# Patient Record
Sex: Female | Born: 1980 | Race: Black or African American | Hispanic: No | Marital: Married | Smoking: Never smoker
Health system: Southern US, Community
[De-identification: ages and names within clinical notes are randomized; demographics above are authoritative.]

## PROBLEM LIST (undated history)

## (undated) ENCOUNTER — Emergency Department (HOSPITAL_COMMUNITY): Admission: EM | Payer: Medicaid Other

## (undated) DIAGNOSIS — O24419 Gestational diabetes mellitus in pregnancy, unspecified control: Secondary | ICD-10-CM

## (undated) DIAGNOSIS — I10 Essential (primary) hypertension: Secondary | ICD-10-CM

## (undated) HISTORY — PX: NO PAST SURGERIES: SHX2092

---

## 2015-05-12 NOTE — L&D Delivery Note (Signed)
Operative Delivery Note At 8:54 AM a viable female was delivered via kiwi vacum, placed due to fetal bradycardia and no maternal effort.  Presentation: vertex; Position: Right,, Occiput,, Anterior; Station: 0.  Verbal consent: obtained from patient.  Risks and benefits discussed in detail.  Risks include, but are not limited to the risks of anesthesia, bleeding, infection, damage to maternal tissues, fetal cephalhematoma.  There is also the risk of inability to effect vaginal delivery of the head, or shoulder dystocia that cannot be resolved by established maneuvers, leading to the need for emergency cesarean section.  APGAR:9 ,9 ; weight  .   Placenta status:spontaneous ,shultz .   Cord:  3VC with the following complications:none.  Cord pH: n/a  Anesthesia: none  Instruments: kiwi vacum-placed with 1 pull, delivery of fetal head   Episiotomy:  none Lacerations:  1 degree Suture Repair: 3.0 vicryl Est. Blood Loss (mL):  150  Mom to postpartum.  Baby to Couplet care / Skin to Skin.  Karen Howard 02/10/2016, 9:02 AM

## 2015-05-23 ENCOUNTER — Other Ambulatory Visit: Payer: Self-pay | Admitting: Infectious Disease

## 2015-05-23 ENCOUNTER — Ambulatory Visit
Admission: RE | Admit: 2015-05-23 | Discharge: 2015-05-23 | Disposition: A | Discharge status: Home / Self Care | Payer: No Typology Code available for payment source | Source: Ambulatory Visit | Attending: Infectious Disease | Admitting: Infectious Disease

## 2015-05-23 DIAGNOSIS — R7611 Nonspecific reaction to tuberculin skin test without active tuberculosis: Secondary | ICD-10-CM

## 2015-07-17 DIAGNOSIS — H539 Unspecified visual disturbance: Secondary | ICD-10-CM

## 2015-07-17 DIAGNOSIS — Z23 Encounter for immunization: Secondary | ICD-10-CM

## 2015-07-17 NOTE — Congregational Nurse Program (Signed)
Congregational Nurse Program Note  Date of Encounter: 07/17/2015  Past Medical History: No past medical history on file.  Encounter Details:     CNP Questionnaire - 07/17/15 1415    Patient Demographics   Is this a new or existing patient? New   Patient is considered a/an Refugee   Race African   Patient Assistance   Location of Patient Assistance Not Applicable   Patient's financial/insurance status Medicaid   Uninsured Patient No   Patient referred to apply for the following financial assistance Not Applicable   Food insecurities addressed Not Applicable   Transportation assistance No   Assistance securing medications No   Educational health offerings Navigating the healthcare system;Other   Encounter Details   Primary purpose of visit Navigating the Healthcare System   Was an Emergency Department visit averted? Not Applicable   Does patient have a medical provider? No   Patient referred to Clinic   Was a mental health screening completed? (GAINS tool) No   Does patient have dental issues? No   Does patient have vision issues? Yes   Was a vision referral made? Yes   Since previous encounter, have you referred patient for abnormal blood pressure that resulted in a new diagnosis or medication change? No   Since previous encounter, have you referred patient for abnormal blood glucose that resulted in a new diagnosis or medication change? No     Office visit at [facility] with complaint of visual difficulty reading in classroom and viewing computer.Interpretation line used to communicate language, Swahili. Mother of 8 children. Denies any other health problems. No recent preventive health screens and no assigned PCP. Family members need appointment for immunization update. CBG 122 non-fasting. Plan: Refer to PCP assigned on Medicaid information when available. [facility] vacc[facility]. Refer for visual exam.

## 2015-09-08 ENCOUNTER — Emergency Department (HOSPITAL_COMMUNITY)
Admission: EM | Admit: 2015-09-08 | Discharge: 2015-09-08 | Disposition: A | Discharge status: Home / Self Care | Payer: Medicaid Other | Attending: Emergency Medicine | Admitting: Emergency Medicine

## 2015-09-08 ENCOUNTER — Encounter (HOSPITAL_COMMUNITY): Payer: Self-pay | Admitting: *Deleted

## 2015-09-08 ENCOUNTER — Emergency Department (HOSPITAL_COMMUNITY): Payer: Medicaid Other

## 2015-09-08 DIAGNOSIS — Z3490 Encounter for supervision of normal pregnancy, unspecified, unspecified trimester: Secondary | ICD-10-CM

## 2015-09-08 DIAGNOSIS — O368190 Decreased fetal movements, unspecified trimester, not applicable or unspecified: Secondary | ICD-10-CM

## 2015-09-08 DIAGNOSIS — Z3201 Encounter for pregnancy test, result positive: Secondary | ICD-10-CM | POA: Diagnosis not present

## 2015-09-08 DIAGNOSIS — Z3200 Encounter for pregnancy test, result unknown: Secondary | ICD-10-CM | POA: Diagnosis present

## 2015-09-08 LAB — COMPREHENSIVE METABOLIC PANEL
ALBUMIN: 3.3 g/dL — AB (ref 3.5–5.0)
ALK PHOS: 29 U/L — AB (ref 38–126)
ALT: 10 U/L — ABNORMAL LOW (ref 14–54)
AST: 17 U/L (ref 15–41)
Anion gap: 9 (ref 5–15)
BILIRUBIN TOTAL: 0.8 mg/dL (ref 0.3–1.2)
CALCIUM: 8.9 mg/dL (ref 8.9–10.3)
CO2: 22 mmol/L (ref 22–32)
Chloride: 106 mmol/L (ref 101–111)
Creatinine, Ser: 0.53 mg/dL (ref 0.44–1.00)
GFR calc Af Amer: >60 mL/min (ref >60)
GFR calc non Af Amer: >60 mL/min (ref >60)
GLUCOSE: 96 mg/dL (ref 65–99)
Potassium: 4.3 mmol/L (ref 3.5–5.1)
SODIUM: 137 mmol/L (ref 135–145)
TOTAL PROTEIN: 7 g/dL (ref 6.5–8.1)

## 2015-09-08 LAB — CBC
HEMATOCRIT: 34.3 % — AB (ref 36.0–46.0)
Hemoglobin: 12.1 g/dL (ref 12.0–15.0)
MCH: 29.9 pg (ref 26.0–34.0)
MCHC: 35.3 g/dL (ref 30.0–36.0)
MCV: 84.7 fL (ref 78.0–100.0)
Platelets: 228 10*3/uL (ref 150–400)
RBC: 4.05 MIL/uL (ref 3.87–5.11)
RDW: 13.2 % (ref 11.5–15.5)
WBC: 6 10*3/uL (ref 4.0–10.5)

## 2015-09-08 LAB — HCG, QUANTITATIVE, PREGNANCY: hCG, Beta Chain, Quant, S: 35692 m[IU]/mL — ABNORMAL HIGH (ref <5)

## 2015-09-08 LAB — ABO/RH: ABO/RH(D): A POS

## 2015-09-08 LAB — URINALYSIS, ROUTINE W REFLEX MICROSCOPIC
BILIRUBIN URINE: NEGATIVE
Glucose, UA: NEGATIVE mg/dL
HGB URINE DIPSTICK: NEGATIVE
KETONES UR: NEGATIVE mg/dL
Leukocytes, UA: NEGATIVE
NITRITE: NEGATIVE
PH: 8 (ref 5.0–8.0)
Protein, ur: NEGATIVE mg/dL
Specific Gravity, Urine: 1.009 (ref 1.005–1.030)

## 2015-09-08 LAB — I-STAT BETA HCG BLOOD, ED (MC, WL, AP ONLY): I-stat hCG, quantitative: >2000 m[IU]/mL — ABNORMAL HIGH (ref <5)

## 2015-09-08 MED ORDER — PRENATAL MULTIVITAMIN CH: 1.0000 | ORAL_TABLET | Freq: Every day | ORAL | Status: DC

## 2015-09-08 NOTE — ED Notes (Signed)
Pt reports needs a pregnancy test so she can start going to clinic. Last menstrual cycle was in feb but reports having some lower abd pain, denies any vaginal discharge or bleeding.

## 2015-09-08 NOTE — ED Provider Notes (Signed)
CSN: [id number]     Arrival date & time 09/08/15  1001 History   First MD Initiated Contact with Patient 09/08/15 1139     Chief Complaint  Patient presents with  . Possible Pregnancy   HPI Patient is a G10P9 who presents for pregnancy test and decreased fetal movement. Patient believes she is about 4 months pregnant with her LMP some time in December. She has not yet received any prenatal care. She reports that with her other pregnancy she felt movement by this point in pregnancy so she is worried that she hasn't felt this baby move yet. She denies any bleeding, LOF or discharge. She reports that she sometimes gets some abdominal pain when riding in the car but is not having any pain right now.   History reviewed. No pertinent past medical history. History reviewed. No pertinent past surgical history. History reviewed. No pertinent family history. Social History  Substance Use Topics  . Smoking status: Never Smoker   . Smokeless tobacco: None  . Alcohol Use: No   OB History    No data available     Review of Systems  All other systems reviewed and are negative.  See HPI   Allergies  Review of patient's allergies indicates no known allergies.  Home Medications   Prior to Admission medications   Not on File   BP 109/64 mmHg  Pulse 71  Temp(Src) 97.9 F (36.6 C) (Oral)  Resp 18  SpO2 99% Physical Exam  Constitutional: She is oriented to person, place, and time. She appears well-developed and well-nourished. No distress.  HENT:  Head: Normocephalic and atraumatic.  Right Ear: External ear normal.  Left Ear: External ear normal.  Nose: Nose normal.  Mouth/Throat: Oropharynx is clear and moist.  Eyes: Conjunctivae are normal. Pupils are equal, round, and reactive to light. Right eye exhibits no discharge. Left eye exhibits no discharge.  Neck: Normal range of motion. Neck supple.  Cardiovascular: Normal rate, regular rhythm, normal heart sounds and intact distal pulses.    No murmur heard. Pulmonary/Chest: Effort normal and breath sounds normal. No respiratory distress. She has no wheezes.  Abdominal: Soft. Bowel sounds are normal. She exhibits no distension. There is no tenderness.  Gravid uterus  Musculoskeletal: She exhibits no edema.  Neurological: She is alert and oriented to person, place, and time.  Skin: Skin is warm and dry. No rash noted. She is not diaphoretic. No pallor.  Psychiatric: She has a normal mood and affect. Her behavior is normal.  Vitals reviewed.   ED Course  Procedures (including critical care time) Labs Review Labs Reviewed  CBC - Abnormal; Notable for the following:    HCT 34.3 (*)    All other components within normal limits  I-STAT BETA HCG BLOOD, ED ([facility], [facility], [facility] ONLY) - Abnormal; Notable for the following:    I-stat hCG, quantitative >2000.0 (*)    All other components within normal limits  WET PREP, GENITAL  COMPREHENSIVE METABOLIC PANEL  URINALYSIS, ROUTINE W REFLEX MICROSCOPIC (NOT AT [facility])  HCG, QUANTITATIVE, PREGNANCY  ABO/RH  GC/CHLAMYDIA PROBE AMP ([place]) NOT AT [facility]    Imaging Review No results found. I have personally reviewed and evaluated these images and lab results as part of my medical decision-making.   EKG Interpretation None      MDM   Final diagnoses:  None   Pregnant women with unreliable dating and no fetal movement. Will check OB ultrasound for dating and cardiac activity primarily. If  normal will refer to establish routine prenatal care.  Single intrauterine pregnancy at 17 weeks. [facility]. Rx prenatal vitamins.   Abram SanderElena M Ulanda Tackett, MD 09/08/15 1353  Glynn OctaveStephen Rancour, MD 09/09/15 [phone]

## 2015-09-08 NOTE — Discharge Instructions (Signed)
Second Trimester of Pregnancy  The second trimester is from week 13 through week 28, month 4 through 6. This is often the time in pregnancy that you feel your best. Often times, morning sickness has lessened or quit. You may have more energy, and you may get hungry more often. Your unborn baby (fetus) is growing rapidly. At the end of the sixth month, he or she is about 9 inches long and weighs about 1½ pounds. You will likely feel the baby move (quickening) between 18 and 20 weeks of pregnancy.  HOME CARE   · Avoid all smoking, herbs, and alcohol. Avoid drugs not approved by your doctor.  · Do not use any tobacco products, including cigarettes, chewing tobacco, and electronic cigarettes. If you need help quitting, ask your doctor. You may get counseling or other support to help you quit.  · Only take medicine as told by your doctor. Some medicines are safe and some are not during pregnancy.  · Exercise only as told by your doctor. Stop exercising if you start having cramps.  · Eat regular, healthy meals.  · Wear a good support bra if your breasts are tender.  · Do not use hot tubs, steam rooms, or saunas.  · Wear your seat belt when driving.  · Avoid raw meat, uncooked cheese, and liter boxes and soil used by cats.  · Take your prenatal vitamins.  · Take 1500-2000 milligrams of calcium daily starting at the 20th week of pregnancy until you deliver your baby.  · Try taking medicine that helps you poop (stool softener) as needed, and if your doctor approves. Eat more fiber by eating fresh fruit, vegetables, and whole grains. Drink enough fluids to keep your pee (urine) clear or pale yellow.  · Take warm water baths (sitz baths) to soothe pain or discomfort caused by hemorrhoids. Use hemorrhoid cream if your doctor approves.  · If you have puffy, bulging veins (varicose veins), wear support hose. Raise (elevate) your feet for 15 minutes, 3-4 times a day. Limit salt in your diet.  · Avoid heavy lifting, wear low heals,  and sit up straight.  · Rest with your legs raised if you have leg cramps or low back pain.  · Visit your dentist if you have not gone during your pregnancy. Use a soft toothbrush to brush your teeth. Be gentle when you floss.  · You can have sex (intercourse) unless your doctor tells you not to.  · Go to your doctor visits.  GET HELP IF:   · You feel dizzy.  · You have mild cramps or pressure in your lower belly (abdomen).  · You have a nagging pain in your belly area.  · You continue to feel sick to your stomach (nauseous), throw up (vomit), or have watery poop (diarrhea).  · You have bad smelling fluid coming from your vagina.  · You have pain with peeing (urination).  GET HELP RIGHT AWAY IF:   · You have a fever.  · You are leaking fluid from your vagina.  · You have spotting or bleeding from your vagina.  · You have severe belly cramping or pain.  · You lose or gain weight rapidly.  · You have trouble catching your breath and have chest pain.  · You notice sudden or extreme puffiness (swelling) of your face, hands, ankles, feet, or legs.  · You have not felt the baby move in over an hour.  · You have severe headaches that do   not go away with medicine.  · You have vision changes.     This information is not intended to replace advice given to you by your health care provider. Make sure you discuss any questions you have with your health care provider.     Document Released: 07/22/2009 Document Revised: 05/18/2014 Document Reviewed: 06/28/2012  Elsevier Interactive Patient Education ©2016 Elsevier Inc.

## 2015-11-05 ENCOUNTER — Telehealth: Payer: Self-pay | Admitting: *Deleted

## 2015-11-05 NOTE — Telephone Encounter (Signed)
Appointment made for Follow up /17 weeks

## 2015-11-27 ENCOUNTER — Other Ambulatory Visit (HOSPITAL_COMMUNITY)
Admission: RE | Admit: 2015-11-27 | Discharge: 2015-11-27 | Disposition: A | Discharge status: Home / Self Care | Payer: Medicaid Other | Source: Ambulatory Visit | Attending: Family | Admitting: Family

## 2015-11-27 ENCOUNTER — Encounter: Payer: Self-pay | Admitting: Family Medicine

## 2015-11-27 ENCOUNTER — Encounter: Payer: Self-pay | Admitting: Family

## 2015-11-27 ENCOUNTER — Ambulatory Visit (INDEPENDENT_AMBULATORY_CARE_PROVIDER_SITE_OTHER): Payer: Medicaid Other | Admitting: Family

## 2015-11-27 VITALS — BP 115/67 | HR 72 | Wt 179.1 lb

## 2015-11-27 DIAGNOSIS — Z01419 Encounter for gynecological examination (general) (routine) without abnormal findings: Secondary | ICD-10-CM | POA: Insufficient documentation

## 2015-11-27 DIAGNOSIS — Z124 Encounter for screening for malignant neoplasm of cervix: Secondary | ICD-10-CM

## 2015-11-27 DIAGNOSIS — O09523 Supervision of elderly multigravida, third trimester: Secondary | ICD-10-CM

## 2015-11-27 DIAGNOSIS — Z3482 Encounter for supervision of other normal pregnancy, second trimester: Secondary | ICD-10-CM

## 2015-11-27 DIAGNOSIS — O09529 Supervision of elderly multigravida, unspecified trimester: Secondary | ICD-10-CM | POA: Insufficient documentation

## 2015-11-27 DIAGNOSIS — O09522 Supervision of elderly multigravida, second trimester: Secondary | ICD-10-CM | POA: Diagnosis present

## 2015-11-27 DIAGNOSIS — Z113 Encounter for screening for infections with a predominantly sexual mode of transmission: Secondary | ICD-10-CM

## 2015-11-27 DIAGNOSIS — Z1151 Encounter for screening for human papillomavirus (HPV): Secondary | ICD-10-CM | POA: Insufficient documentation

## 2015-11-27 DIAGNOSIS — Z3480 Encounter for supervision of other normal pregnancy, unspecified trimester: Secondary | ICD-10-CM | POA: Insufficient documentation

## 2015-11-27 DIAGNOSIS — Z3492 Encounter for supervision of normal pregnancy, unspecified, second trimester: Secondary | ICD-10-CM

## 2015-11-27 DIAGNOSIS — O09513 Supervision of elderly primigravida, third trimester: Secondary | ICD-10-CM

## 2015-11-27 LAB — OB RESULTS CONSOLE HIV ANTIBODY (ROUTINE TESTING): HIV: NONREACTIVE

## 2015-11-27 LAB — POCT URINALYSIS DIP (DEVICE)
BILIRUBIN URINE: NEGATIVE
Glucose, UA: NEGATIVE mg/dL
HGB URINE DIPSTICK: NEGATIVE
KETONES UR: NEGATIVE mg/dL
Nitrite: NEGATIVE
PH: 6 (ref 5.0–8.0)
Protein, ur: NEGATIVE mg/dL
Specific Gravity, Urine: 1.01 (ref 1.005–1.030)
Urobilinogen, UA: 0.2 mg/dL (ref 0.0–1.0)

## 2015-11-27 LAB — OB RESULTS CONSOLE GC/CHLAMYDIA: GC PROBE AMP, GENITAL: NEGATIVE

## 2015-11-27 LAB — OB RESULTS CONSOLE RPR: RPR: NONREACTIVE

## 2015-11-27 NOTE — Progress Notes (Signed)
1 hour gtt given due  At 9:15

## 2015-11-27 NOTE — Progress Notes (Signed)
   Subjective:    Karen Howard is a G9P0 5843w4d being seen today for her first obstetrical visit.  Her obstetrical history is significant for advanced maternal age. History [place]  Reports no problems or complications with any.   Patient does intend to breast feed. Pregnancy history fully reviewed.  Patient reports no complaints.  Filed Vitals:   11/27/15 0806  BP: 115/67  Pulse: 72  Weight: 179 lb 1.6 oz (81.239 kg)    HISTORY: OB History  Gravida Para Term Preterm AB SAB TAB Ectopic Multiple Living  9        0 8    # Outcome Date GA Lbr Len/2nd Weight Sex Delivery Anes PTL Lv  9 Current           8 Gravida 09/19/13    F   N Y  7 Gravida 11/26/11    M    Y  6 Gravida 06/25/08    M    Y  5 Gravida 07/08/05    F    Y  4 Gravida 06/23/02    F    Y  3 Gravida 01/20/00    Reuben LikesM   Y Y  2 Gravida 07/06/97    M   N Y  1 Gravida 06/22/94    F    Y     History reviewed. No pertinent past medical history. History reviewed. No pertinent past surgical history. History reviewed. No pertinent family history.   Exam   BP 115/67 mmHg  Pulse 72  Wt 179 lb 1.6 oz (81.239 kg)  LMP 05/18/2015  Breastfeeding? Unknown Uterine Size: size equals dates  Pelvic Exam:    Perineum: No Hemorrhoids, Normal Perineum   Vulva: normal   Vagina:  normal mucosa, normal discharge, no palpable nodules   pH: Not done   Cervix: Some  bleeding following Pap which resolved prior to leaving, no cervical motion tenderness and no lesions   Adnexa: normal adnexa and no mass, fullness, tenderness   Bony Pelvis: Adequate  System: Breast:  No nipple retraction or dimpling, No nipple discharge or bleeding, No axillary or supraclavicular adenopathy, Normal to palpation without dominant masses   Skin: normal coloration and turgor, no rashes    Neurologic: negative   Extremities: normal strength, tone, and muscle mass   HEENT neck supple with midline trachea and thyroid  without masses   Mouth/Teeth mucous membranes moist, pharynx normal without lesions   Neck supple and no masses   Cardiovascular: regular rate and rhythm, no murmurs or gallops   Respiratory:  appears well, vitals normal, no respiratory distress, acyanotic, normal RR, neck free of mass or lymphadenopathy, chest clear, no wheezing, crepitations, rhonchi, normal symmetric air entry   Abdomen: soft, non-tender; bowel sounds normal; no masses,  no organomegaly   Urinary: urethral meatus normal      Assessment:    Pregnancy: [id number]  Patient Active Problem List   Diagnosis Date Noted  . Supervision of normal pregnancy, antepartum 11/27/2015        Plan:     Initial labs drawn.  Pap smear collected.  1 hour today Prenatal vitamins. Problem list reviewed and updated. Genetic Screening:  Too late  Ultrasound discussed; fetal survey: ordered.  Follow up in 2 weeks.  Marlis EdelsonKARIM, Shameika Speelman N 11/27/2015

## 2015-11-27 NOTE — Addendum Note (Signed)
Addended by: Cheree DittoGRAHAM, DEMETRICE A on: 11/27/2015 03:24 PM   Modules accepted: Orders

## 2015-11-27 NOTE — Patient Instructions (Signed)

## 2015-11-28 LAB — PAIN MGMT, PROFILE 6 CONF W/O MM, U
6 Acetylmorphine: NEGATIVE ng/mL (ref <10)
ALCOHOL METABOLITES: NEGATIVE ng/mL (ref <500)
Amphetamines: NEGATIVE ng/mL (ref <500)
BARBITURATES: NEGATIVE ng/mL (ref <300)
BENZODIAZEPINES: NEGATIVE ng/mL (ref <100)
COCAINE METABOLITE: NEGATIVE ng/mL (ref <150)
CREATININE: 74.6 mg/dL (ref >=20.0)
MARIJUANA METABOLITE: NEGATIVE ng/mL (ref <20)
METHADONE METABOLITE: NEGATIVE ng/mL (ref <100)
OXYCODONE: NEGATIVE ng/mL (ref <100)
Opiates: NEGATIVE ng/mL (ref <100)
Oxidant: NEGATIVE ug/mL (ref <200)
PHENCYCLIDINE: NEGATIVE ng/mL (ref <25)
Please note:: 0
pH: 6.66 (ref 4.5–9.0)

## 2015-11-28 LAB — PRENATAL PROFILE (SOLSTAS)
Antibody Screen: NEGATIVE
Basophils Absolute: 0 cells/uL (ref 0–200)
Basophils Relative: 0 %
Eosinophils Absolute: 595 cells/uL — ABNORMAL HIGH (ref 15–500)
Eosinophils Relative: 7 %
HEMATOCRIT: 32.1 % — AB (ref 35.0–45.0)
HEP B S AG: NEGATIVE
HIV: NONREACTIVE
Hemoglobin: 10.8 g/dL — ABNORMAL LOW (ref 11.7–15.5)
LYMPHS ABS: 2295 {cells}/uL (ref 850–3900)
Lymphocytes Relative: 27 %
MCH: 29.3 pg (ref 27.0–33.0)
MCHC: 33.6 g/dL (ref 32.0–36.0)
MCV: 87.2 fL (ref 80.0–100.0)
MONO ABS: 510 {cells}/uL (ref 200–950)
MPV: 10.8 fL (ref 7.5–12.5)
Monocytes Relative: 6 %
NEUTROS ABS: 5100 {cells}/uL (ref 1500–7800)
NEUTROS PCT: 60 %
Platelets: 196 10*3/uL (ref 140–400)
RBC: 3.68 MIL/uL — AB (ref 3.80–5.10)
RDW: 13.3 % (ref 11.0–15.0)
Rh Type: POSITIVE
Rubella: 13.9 Index — ABNORMAL HIGH (ref <0.90)
WBC: 8.5 10*3/uL (ref 3.8–10.8)

## 2015-11-28 LAB — GLUCOSE TOLERANCE, 1 HOUR (50G) W/O FASTING: Glucose, 1 Hr, gestational: 115 mg/dL (ref <140)

## 2015-11-28 LAB — GC/CHLAMYDIA PROBE AMP (~~LOC~~) NOT AT ARMC
Chlamydia: NEGATIVE
Neisseria Gonorrhea: NEGATIVE

## 2015-11-29 ENCOUNTER — Encounter (HOSPITAL_COMMUNITY): Payer: Self-pay | Admitting: Family

## 2015-11-29 LAB — HEMOGLOBINOPATHY EVALUATION
HEMATOCRIT: 32.1 % — AB (ref 35.0–45.0)
HEMOGLOBIN: 10.8 g/dL — AB (ref 11.7–15.5)
HGB A: 57.2 % — AB (ref >96.0)
HGB S QUANTITAION: 38.2 % — AB
Hgb A2 Quant: 3.6 % — ABNORMAL HIGH (ref 1.8–3.5)
Hgb F Quant: <1 % (ref <2.0)
MCH: 29.3 pg (ref 27.0–33.0)
MCV: 87.2 fL (ref 80.0–100.0)
RBC: 3.68 MIL/uL — AB (ref 3.80–5.10)
RDW: 13.3 % (ref 11.0–15.0)

## 2015-11-29 LAB — CULTURE, OB URINE
Colony Count: NO GROWTH
ORGANISM ID, BACTERIA: NO GROWTH

## 2015-11-29 LAB — CYTOLOGY - PAP

## 2015-12-06 ENCOUNTER — Other Ambulatory Visit: Payer: Self-pay | Admitting: Family

## 2015-12-06 ENCOUNTER — Ambulatory Visit (HOSPITAL_COMMUNITY)
Admission: RE | Admit: 2015-12-06 | Discharge: 2015-12-06 | Disposition: A | Discharge status: Home / Self Care | Payer: Medicaid Other | Source: Ambulatory Visit | Attending: Family | Admitting: Family

## 2015-12-06 ENCOUNTER — Encounter (HOSPITAL_COMMUNITY): Payer: Self-pay

## 2015-12-06 DIAGNOSIS — Z3A28 28 weeks gestation of pregnancy: Secondary | ICD-10-CM | POA: Diagnosis not present

## 2015-12-06 DIAGNOSIS — Z3A31 31 weeks gestation of pregnancy: Secondary | ICD-10-CM

## 2015-12-06 DIAGNOSIS — O09513 Supervision of elderly primigravida, third trimester: Secondary | ICD-10-CM

## 2015-12-06 DIAGNOSIS — Z3482 Encounter for supervision of other normal pregnancy, second trimester: Secondary | ICD-10-CM

## 2015-12-06 DIAGNOSIS — Z315 Encounter for genetic counseling: Secondary | ICD-10-CM | POA: Insufficient documentation

## 2015-12-06 DIAGNOSIS — O09523 Supervision of elderly multigravida, third trimester: Secondary | ICD-10-CM | POA: Diagnosis not present

## 2015-12-06 DIAGNOSIS — D573 Sickle-cell trait: Secondary | ICD-10-CM

## 2015-12-06 NOTE — ED Notes (Signed)
Pt here today with FOB and interp from language resources Epimenio Sarin).

## 2015-12-06 NOTE — Progress Notes (Signed)
Genetic Counseling  High-Risk Gestation Note  Appointment Date:  12/06/2015 Referred By: Marlis Edelson, CNM Date of Birth:  1981-03-24 Partner: Karen Howard   Pregnancy History: [id number] Estimated Date of Delivery: 02/22/16 Estimated Gestational Age: [redacted]w[redacted]d Attending: Alpha Gula, MD  Ms. Karen Howard, Karen Howard, were seen for genetic counseling because of a maternal age of 35 y.o.. Medical interpreter, Karen Howard, from [employer] provided interpretation for today's visit.     In summary:  Discussed AMA and associated risk for fetal aneuploidy  Discussed options for screening  NIPS- declined  Ultrasound- performed today  Discussed diagnostic testing options  Amniocentesis-declined  Reviewed family history concerns-sickle cell trait (Hemoglobin AS trait) for the patient  Two of the couple's 8 children also with sickle cell trait  Reviewed autosomal recessive inheritance  Father of the pregnancy reported he was previously screened and was negative  Couple is aware that sickle cell is assessed on newborn screening  Discussed carrier screening options  CF-declined  SMA-declined  They were counseled regarding maternal age and the association with risk for chromosome conditions due to nondisjunction with aging of the ova.   We reviewed chromosomes, nondisjunction, and the associated 1 in 123 risk for fetal aneuploidy related to a maternal age of 35 y.o. at delivery. They were counseled that the risk for aneuploidy decreases as gestational age increases, accounting for those pregnancies which spontaneously abort.  We specifically discussed Down syndrome (trisomy 50), trisomies 57 and 32, and sex chromosome aneuploidies (47,XXX and 47,XXY) including the common features and prognoses of each.   We reviewed available screening options including noninvasive prenatal screening (NIPS)/cell free DNA (cfDNA) screening and detailed ultrasound.  Screening tests  are used to modify a patient's a priori risk for aneuploidy, typically based on age. This estimate provides a pregnancy specific risk assessment. We reviewed the benefits and limitations of each option. They were also counseled regarding diagnostic testing via amniocentesis, including risks, benefits, and limitations.  After consideration of all the options, they declined NIPS and amniocentesis.     A complete ultrasound was performed today. The ultrasound report will be sent under separate cover. There were no visualized fetal anomalies or markers suggestive of aneuploidy. They understand that screening tests cannot rule out all birth defects or genetic syndromes. The patient was advised of this limitation and states she still does not want additional testing at this time.   The patient reported that she and two of the couple's children have sickle cell trait. Medical records confirm that Karen Howard had hemoglobin electrophoresis, which identified hemoglobin AS, or sickle cell trait. Karen Howard reported that he has been screened in the past and does not have sickle cell trait. We do not have medical documentation to confirm this report. We discussed that sickle cell anemia (SCA) affects the shape and function of the red blood cell by producing abnormal hemoglobin. They were counseled that hemoglobin is a protein in the RBCs that carries oxygen to the body's organs. Individuals who have SCA have a changes within the genes that codes for hemoglobin. This couple was counseled that SCA is inherited in an autosomal recessive manner, and occurs when both copies of the hemoglobin gene are changed and produce an abnormal hemoglobin S. Typically, one abnormal gene for the production of hemoglobin S is inherited from each parent. A carrier of SCA has one altered copy of the gene for hemoglobin and one typical working copy. Carriers of recessive conditions typically do not  have symptoms related to the condition because  they still have one functioning copy of the gene, and thus some production of the typical protein coded for by that gene. A pregnancy would only be at risk to inherit sickle cell anemia or other hemoglobinopathy if both parents are carriers. Given the report, the pregnancy would not be expected to be at risk for SCA but a 50% chance for sickle cell trait. We discussed the option of hemoglobin electrophoresis for the father of the pregnancy. We also reviewed the availability of newborn screening in [place] for hemoglobinopathies. The couple stated they are comfortable with newborn screening for hemoglobinopathies.   Karen Howard was provided with written information regarding cystic fibrosis (CF), spinal muscular atrophy (SMA) and hemoglobinopathies including the carrier frequency, availability of carrier screening and prenatal diagnosis if indicated.  In addition, we discussed that CF and hemoglobinopathies are routinely screened for as part of the [facility] newborn screening panel.  After further discussion, she declined screening for CF and SMA.  Both family histories were reviewed and found to be otherwise noncontributory for birth defects, intellectual disability, and known genetic conditions. Without further information regarding the provided family history, an accurate genetic risk cannot be calculated. Further genetic counseling is warranted if more information is obtained.  Karen Howard denied exposure to environmental toxins or chemical agents. She denied the use of alcohol, tobacco or street drugs. She denied significant viral illnesses during the course of her pregnancy. Her medical and surgical histories were noncontributory.   I counseled this couple regarding the above risks and available options.  The approximate face-to-face time with the genetic counselor was 40 minutes.  Karen Plowman, MS,  Certified [employer] 12/06/2015

## 2015-12-11 ENCOUNTER — Encounter: Payer: Self-pay | Admitting: Advanced Practice Midwife

## 2015-12-11 ENCOUNTER — Ambulatory Visit (INDEPENDENT_AMBULATORY_CARE_PROVIDER_SITE_OTHER): Payer: Medicaid Other | Admitting: Advanced Practice Midwife

## 2015-12-11 VITALS — BP 108/62 | HR 75 | Wt 179.2 lb

## 2015-12-11 DIAGNOSIS — Z3493 Encounter for supervision of normal pregnancy, unspecified, third trimester: Secondary | ICD-10-CM

## 2015-12-11 DIAGNOSIS — Z3482 Encounter for supervision of other normal pregnancy, second trimester: Secondary | ICD-10-CM

## 2015-12-11 LAB — POCT URINALYSIS DIP (DEVICE)
BILIRUBIN URINE: NEGATIVE
Glucose, UA: NEGATIVE mg/dL
Ketones, ur: NEGATIVE mg/dL
NITRITE: NEGATIVE
PH: 7 (ref 5.0–8.0)
PROTEIN: NEGATIVE mg/dL
Specific Gravity, Urine: 1.01 (ref 1.005–1.030)
UROBILINOGEN UA: 0.2 mg/dL (ref 0.0–1.0)

## 2015-12-11 NOTE — Progress Notes (Signed)
Patient ID: Karen Howard, female   DOB: 01/14/1981, 35 y.o.   MRN: [id number]

## 2015-12-11 NOTE — Patient Instructions (Addendum)

## 2015-12-11 NOTE — Progress Notes (Signed)
Subjective:  Karen Howard is a 35 y.o. [id number] at [redacted]w[redacted]d being seen today for ongoing prenatal care.  She is currently monitored for the following issues for this low-risk pregnancy and has Supervision of normal pregnancy, antepartum; Advanced maternal age in multigravida; and Sickle cell trait (HCC) on her problem list.  Patient reports no complaints. States she wants to stop work.  They make her stand and she gets tired. Contractions: Not present. Vag. Bleeding: None.  Movement: Present. Denies leaking of fluid.   The following portions of the patient's history were reviewed and updated as appropriate: allergies, current medications, past family history, past medical history, past social history, past surgical history and problem list. Problem list updated.  Objective:   Vitals:   12/11/15 1141  BP: 108/62  Pulse: 75  Weight: 179 lb 3.2 oz (81.3 kg)    Fetal Status: Fetal Heart Rate (bpm): 150   Movement: Present     General:  Alert, oriented and cooperative. Patient is in no acute distress.  Skin: Skin is warm and dry. No rash noted.   Cardiovascular: Normal heart rate noted  Respiratory: Normal respiratory effort, no problems with respiration noted  Abdomen: Soft, gravid, appropriate for gestational age. Pain/Pressure: Present     Pelvic:  Cervical exam deferred        Extremities: Normal range of motion.  Edema: None  Mental Status: Normal mood and affect. Normal behavior. Normal judgment and thought content.   Urinalysis: Urine Protein: Negative Urine Glucose: Negative  Assessment and Plan:  Pregnancy: [id number] at [redacted]w[redacted]d  1. Supervision of normal pregnancy, antepartum, second trimester       Wants to stop work. Says they make her stand and she gets tired      Tried to explain disability and FMLA.       Patient instructed to get papers from workplace     Cannot certify medical disability      Discussed if you stop work now, your 12 weeks will run out at [facility]  Preterm labor  symptoms and general obstetric precautions including but not limited to vaginal bleeding, contractions, leaking of fluid and fetal movement were reviewed in detail with the patient. Please refer to After Visit Summary for other counseling recommendations.  Return in about 2 weeks (around 12/25/2015) for [facility].   Aviva Signs, CNM

## 2015-12-11 NOTE — Progress Notes (Signed)
Urine: trace of blood and leukocytes

## 2015-12-25 ENCOUNTER — Ambulatory Visit (INDEPENDENT_AMBULATORY_CARE_PROVIDER_SITE_OTHER): Payer: Medicaid Other | Admitting: Family Medicine

## 2015-12-25 ENCOUNTER — Encounter: Payer: Self-pay | Admitting: Family Medicine

## 2015-12-25 VITALS — BP 106/52 | HR 72 | Wt 181.0 lb

## 2015-12-25 DIAGNOSIS — L299 Pruritus, unspecified: Secondary | ICD-10-CM | POA: Diagnosis not present

## 2015-12-25 DIAGNOSIS — Z23 Encounter for immunization: Secondary | ICD-10-CM

## 2015-12-25 DIAGNOSIS — Z3483 Encounter for supervision of other normal pregnancy, third trimester: Secondary | ICD-10-CM

## 2015-12-25 DIAGNOSIS — Z641 Problems related to multiparity: Secondary | ICD-10-CM | POA: Insufficient documentation

## 2015-12-25 LAB — POCT URINALYSIS DIP (DEVICE)
Bilirubin Urine: NEGATIVE
Glucose, UA: NEGATIVE mg/dL
HGB URINE DIPSTICK: NEGATIVE
KETONES UR: NEGATIVE mg/dL
LEUKOCYTES UA: NEGATIVE
NITRITE: NEGATIVE
PH: 6.5 (ref 5.0–8.0)
Protein, ur: NEGATIVE mg/dL
Specific Gravity, Urine: 1.01 (ref 1.005–1.030)
Urobilinogen, UA: 0.2 mg/dL (ref 0.0–1.0)

## 2015-12-25 MED ORDER — TETANUS-DIPHTH-ACELL PERTUSSIS 5-2.5-18.5 LF-MCG/0.5 IM SUSP
0.5000 mL | Freq: Once | INTRAMUSCULAR | Status: AC
  Administered 2015-12-25: 0.5 mL via INTRAMUSCULAR

## 2015-12-25 NOTE — Patient Instructions (Signed)
Third Trimester of Pregnancy The third trimester is from week 29 through week 42, months 7 through 9. The third trimester is a time when the fetus is growing rapidly. At the end of the ninth month, the fetus is about 20 inches in length and weighs 6-10 pounds.  BODY CHANGES Your body goes through many changes during pregnancy. The changes vary from woman to woman.   Your weight will continue to increase. You can expect to gain 25-35 pounds (11-16 kg) by the end of the pregnancy.  You may begin to get stretch marks on your hips, abdomen, and breasts.  You may urinate more often because the fetus is moving lower into your pelvis and pressing on your bladder.  You may develop or continue to have heartburn as a result of your pregnancy.  You may develop constipation because certain hormones are causing the muscles that push waste through your intestines to slow down.  You may develop hemorrhoids or swollen, bulging veins (varicose veins).  You may have pelvic pain because of the weight gain and pregnancy hormones relaxing your joints between the bones in your pelvis. Backaches may result from overexertion of the muscles supporting your posture.  You may have changes in your hair. These can include thickening of your hair, rapid growth, and changes in texture. Some women also have hair loss during or after pregnancy, or hair that feels dry or thin. Your hair will most likely return to normal after your baby is born.  Your breasts will continue to grow and be tender. A yellow discharge may leak from your breasts called colostrum.  Your belly button may stick out.  You may feel short of breath because of your expanding uterus.  You may notice the fetus "dropping," or moving lower in your abdomen.  You may have a bloody mucus discharge. This usually occurs a few days to a week before labor begins.  Your cervix becomes thin and soft (effaced) near your due date. WHAT TO EXPECT AT YOUR  PRENATAL EXAMS  You will have prenatal exams every 2 weeks until week 36. Then, you will have weekly prenatal exams. During a routine prenatal visit:  You will be weighed to make sure you and the fetus are growing normally.  Your blood pressure is taken.  Your abdomen will be measured to track your baby's growth.  The fetal heartbeat will be listened to.  Any test results from the previous visit will be discussed.  You may have a cervical check near your due date to see if you have effaced. At around 36 weeks, your caregiver will check your cervix. At the same time, your caregiver will also perform a test on the secretions of the vaginal tissue. This test is to determine if a type of bacteria, Group B streptococcus, is present. Your caregiver will explain this further. Your caregiver may ask you:  What your birth plan is.  How you are feeling.  If you are feeling the baby move.  If you have had any abnormal symptoms, such as leaking fluid, bleeding, severe headaches, or abdominal cramping.  If you are using any tobacco products, including cigarettes, chewing tobacco, and electronic cigarettes.  If you have any questions. Other tests or screenings that may be performed during your third trimester include:  Blood tests that check for low iron levels (anemia).  Fetal testing to check the health, activity level, and growth of the fetus. Testing is done if you have certain medical conditions or if   there are problems during the pregnancy.  HIV (human immunodeficiency virus) testing. If you are at high risk, you may be screened for HIV during your third trimester of pregnancy. FALSE LABOR You may feel small, irregular contractions that eventually go away. These are called Braxton Hicks contractions, or false labor. Contractions may last for hours, days, or even weeks before true labor sets in. If contractions come at regular intervals, intensify, or become painful, it is best to be seen  by your caregiver.  SIGNS OF LABOR   Menstrual-like cramps.  Contractions that are 5 minutes apart or less.  Contractions that start on the top of the uterus and spread down to the lower abdomen and back.  A sense of increased pelvic pressure or back pain.  A watery or bloody mucus discharge that comes from the vagina. If you have any of these signs before the 37th week of pregnancy, call your caregiver right away. You need to go to the hospital to get checked immediately. HOME CARE INSTRUCTIONS   Avoid all smoking, herbs, alcohol, and unprescribed drugs. These chemicals affect the formation and growth of the baby.  Do not use any tobacco products, including cigarettes, chewing tobacco, and electronic cigarettes. If you need help quitting, ask your health care provider. You may receive counseling support and other resources to help you quit.  Follow your caregiver's instructions regarding medicine use. There are medicines that are either safe or unsafe to take during pregnancy.  Exercise only as directed by your caregiver. Experiencing uterine cramps is a good sign to stop exercising.  Continue to eat regular, healthy meals.  Wear a good support bra for breast tenderness.  Do not use hot tubs, steam rooms, or saunas.  Wear your seat belt at all times when driving.  Avoid raw meat, uncooked cheese, cat litter boxes, and soil used by cats. These carry germs that can cause birth defects in the baby.  Take your prenatal vitamins.  Take 1500-2000 mg of calcium daily starting at the 20th week of pregnancy until you deliver your baby.  Try taking a stool softener (if your caregiver approves) if you develop constipation. Eat more high-fiber foods, such as fresh vegetables or fruit and whole grains. Drink plenty of fluids to keep your urine clear or pale yellow.  Take warm sitz baths to soothe any pain or discomfort caused by hemorrhoids. Use hemorrhoid cream if your caregiver  approves.  If you develop varicose veins, wear support hose. Elevate your feet for 15 minutes, 3-4 times a day. Limit salt in your diet.  Avoid heavy lifting, wear low heal shoes, and practice good posture.  Rest a lot with your legs elevated if you have leg cramps or low back pain.  Visit your dentist if you have not gone during your pregnancy. Use a soft toothbrush to brush your teeth and be gentle when you floss.  A sexual relationship may be continued unless your caregiver directs you otherwise.  Do not travel far distances unless it is absolutely necessary and only with the approval of your caregiver.  Take prenatal classes to understand, practice, and ask questions about the labor and delivery.  Make a trial run to the hospital.  Pack your hospital bag.  Prepare the baby's nursery.  Continue to go to all your prenatal visits as directed by your caregiver. SEEK MEDICAL CARE IF:  You are unsure if you are in labor or if your water has broken.  You have dizziness.  You have   mild pelvic cramps, pelvic pressure, or nagging pain in your abdominal area.  You have persistent nausea, vomiting, or diarrhea.  You have a bad smelling vaginal discharge.  You have pain with urination. SEEK IMMEDIATE MEDICAL CARE IF:   You have a fever.  You are leaking fluid from your vagina.  You have spotting or bleeding from your vagina.  You have severe abdominal cramping or pain.  You have rapid weight loss or gain.  You have shortness of breath with chest pain.  You notice sudden or extreme swelling of your face, hands, ankles, feet, or legs.  You have not felt your baby move in over an hour.  You have severe headaches that do not go away with medicine.  You have vision changes.   This information is not intended to replace advice given to you by your health care provider. Make sure you discuss any questions you have with your health care provider.   Document Released:  04/21/2001 Document Revised: 05/18/2014 Document Reviewed: 06/28/2012 Elsevier Interactive Patient Education 2016 [employer]  Breastfeeding Deciding to breastfeed is one of the best choices you can make for you and your baby. A change in hormones during pregnancy causes your breast tissue to grow and increases the number and size of your milk ducts. These hormones also allow proteins, sugars, and fats from your blood supply to make breast milk in your milk-producing glands. Hormones prevent breast milk from being released before your baby is born as well as prompt milk flow after birth. Once breastfeeding has begun, thoughts of your baby, as well as his or her sucking or crying, can stimulate the release of milk from your milk-producing glands.  BENEFITS OF BREASTFEEDING For Your Baby  Your first milk (colostrum) helps your baby's digestive system function better.  There are antibodies in your milk that help your baby fight off infections.  Your baby has a lower incidence of asthma, allergies, and sudden infant death syndrome.  The nutrients in breast milk are better for your baby than infant formulas and are designed uniquely for your baby's needs.  Breast milk improves your baby's brain development.  Your baby is less likely to develop other conditions, such as childhood obesity, asthma, or type 2 diabetes mellitus. For You  Breastfeeding helps to create a very special bond between you and your baby.  Breastfeeding is convenient. Breast milk is always available at the correct temperature and costs nothing.  Breastfeeding helps to burn calories and helps you lose the weight gained during pregnancy.  Breastfeeding makes your uterus contract to its prepregnancy size faster and slows bleeding (lochia) after you give birth.   Breastfeeding helps to lower your risk of developing type 2 diabetes mellitus, osteoporosis, and breast or ovarian cancer later in life. SIGNS THAT YOUR BABY IS  HUNGRY Early Signs of Hunger  Increased alertness or activity.  Stretching.  Movement of the head from side to side.  Movement of the head and opening of the mouth when the corner of the mouth or cheek is stroked (rooting).  Increased sucking sounds, smacking lips, cooing, sighing, or squeaking.  Hand-to-mouth movements.  Increased sucking of fingers or hands. Late Signs of Hunger  Fussing.  Intermittent crying. Extreme Signs of Hunger Signs of extreme hunger will require calming and consoling before your baby will be able to breastfeed successfully. Do not wait for the following signs of extreme hunger to occur before you initiate breastfeeding:  Restlessness.  A loud, strong cry.  Screaming.   BREASTFEEDING BASICS Breastfeeding Initiation  Find a comfortable place to sit or lie down, with your neck and back well supported.  Place a pillow or rolled up blanket under your baby to bring him or her to the level of your breast (if you are seated). Nursing pillows are specially designed to help support your arms and your baby while you breastfeed.  Make sure that your baby's abdomen is facing your abdomen.  Gently massage your breast. With your fingertips, massage from your chest wall toward your nipple in a circular motion. This encourages milk flow. You may need to continue this action during the feeding if your milk flows slowly.  Support your breast with 4 fingers underneath and your thumb above your nipple. Make sure your fingers are well away from your nipple and your baby's mouth.  Stroke your baby's lips gently with your finger or nipple.  When your baby's mouth is open wide enough, quickly bring your baby to your breast, placing your entire nipple and as much of the colored area around your nipple (areola) as possible into your baby's mouth.  More areola should be visible above your baby's upper lip than below the lower lip.  Your baby's tongue should be between his  or her lower gum and your breast.  Ensure that your baby's mouth is correctly positioned around your nipple (latched). Your baby's lips should create a seal on your breast and be turned out (everted).  It is common for your baby to suck about 2-3 minutes in order to start the flow of breast milk. Latching Teaching your baby how to latch on to your breast properly is very important. An improper latch can cause nipple pain and decreased milk supply for you and poor weight gain in your baby. Also, if your baby is not latched onto your nipple properly, he or she may swallow some air during feeding. This can make your baby fussy. Burping your baby when you switch breasts during the feeding can help to get rid of the air. However, teaching your baby to latch on properly is still the best way to prevent fussiness from swallowing air while breastfeeding. Signs that your baby has successfully latched on to your nipple:  Silent tugging or silent sucking, without causing you pain.  Swallowing heard between every 3-4 sucks.  Muscle movement above and in front of his or her ears while sucking. Signs that your baby has not successfully latched on to nipple:  Sucking sounds or smacking sounds from your baby while breastfeeding.  Nipple pain. If you think your baby has not latched on correctly, slip your finger into the corner of your baby's mouth to break the suction and place it between your baby's gums. Attempt breastfeeding initiation again. Signs of Successful Breastfeeding Signs from your baby:  A gradual decrease in the number of sucks or complete cessation of sucking.  Falling asleep.  Relaxation of his or her body.  Retention of a small amount of milk in his or her mouth.  Letting go of your breast by himself or herself. Signs from you:  Breasts that have increased in firmness, weight, and size 1-3 hours after feeding.  Breasts that are softer immediately after  breastfeeding.  Increased milk volume, as well as a change in milk consistency and color by the fifth day of breastfeeding.  Nipples that are not sore, cracked, or bleeding. Signs That Your Baby is Getting Enough Milk  Wetting at least 3 diapers in a 24-hour period.   The urine should be clear and pale yellow by age 5 days.  At least 3 stools in a 24-hour period by age 5 days. The stool should be soft and yellow.  At least 3 stools in a 24-hour period by age 7 days. The stool should be seedy and yellow.  No loss of weight greater than 10% of birth weight during the first 3 days of age.  Average weight gain of 4-7 ounces (113-198 g) per week after age 4 days.  Consistent daily weight gain by age 5 days, without weight loss after the age of 2 weeks. After a feeding, your baby may spit up a small amount. This is common. BREASTFEEDING FREQUENCY AND DURATION Frequent feeding will help you make more milk and can prevent sore nipples and breast engorgement. Breastfeed when you feel the need to reduce the fullness of your breasts or when your baby shows signs of hunger. This is called "breastfeeding on demand." Avoid introducing a pacifier to your baby while you are working to establish breastfeeding (the first 4-6 weeks after your baby is born). After this time you may choose to use a pacifier. Research has shown that pacifier use during the first year of a baby's life decreases the risk of sudden infant death syndrome (SIDS). Allow your baby to feed on each breast as long as he or she wants. Breastfeed until your baby is finished feeding. When your baby unlatches or falls asleep while feeding from the first breast, offer the second breast. Because newborns are often sleepy in the first few weeks of life, you may need to awaken your baby to get him or her to feed. Breastfeeding times will vary from baby to baby. However, the following rules can serve as a guide to help you ensure that your baby is  properly fed:  Newborns (babies 4 weeks of age or younger) may breastfeed every 1-3 hours.  Newborns should not go longer than 3 hours during the day or 5 hours during the night without breastfeeding.  You should breastfeed your baby a minimum of 8 times in a 24-hour period until you begin to introduce solid foods to your baby at around 6 months of age. BREAST MILK PUMPING Pumping and storing breast milk allows you to ensure that your baby is exclusively fed your breast milk, even at times when you are unable to breastfeed. This is especially important if you are going back to work while you are still breastfeeding or when you are not able to be present during feedings. Your lactation consultant can give you guidelines on how long it is safe to store breast milk. A breast pump is a machine that allows you to pump milk from your breast into a sterile bottle. The pumped breast milk can then be stored in a refrigerator or freezer. Some breast pumps are operated by hand, while others use electricity. Ask your lactation consultant which type will work best for you. Breast pumps can be purchased, but some hospitals and breastfeeding support groups lease breast pumps on a monthly basis. A lactation consultant can teach you how to hand express breast milk, if you prefer not to use a pump. CARING FOR YOUR BREASTS WHILE YOU BREASTFEED Nipples can become dry, cracked, and sore while breastfeeding. The following recommendations can help keep your breasts moisturized and healthy:  Avoid using soap on your nipples.  Wear a supportive bra. Although not required, special nursing bras and tank tops are designed to allow access to your   breasts for breastfeeding without taking off your entire bra or top. Avoid wearing underwire-style bras or extremely tight bras.  Air dry your nipples for 3-4minutes after each feeding.  Use only cotton bra pads to absorb leaked breast milk. Leaking of breast milk between feedings  is normal.  Use lanolin on your nipples after breastfeeding. Lanolin helps to maintain your skin's normal moisture barrier. If you use pure lanolin, you do not need to wash it off before feeding your baby again. Pure lanolin is not toxic to your baby. You may also hand express a few drops of breast milk and gently massage that milk into your nipples and allow the milk to air dry. In the first few weeks after giving birth, some women experience extremely full breasts (engorgement). Engorgement can make your breasts feel heavy, warm, and tender to the touch. Engorgement peaks within 3-5 days after you give birth. The following recommendations can help ease engorgement:  Completely empty your breasts while breastfeeding or pumping. You may want to start by applying warm, moist heat (in the shower or with warm water-soaked hand towels) just before feeding or pumping. This increases circulation and helps the milk flow. If your baby does not completely empty your breasts while breastfeeding, pump any extra milk after he or she is finished.  Wear a snug bra (nursing or regular) or tank top for 1-2 days to signal your body to slightly decrease milk production.  Apply ice packs to your breasts, unless this is too uncomfortable for you.  Make sure that your baby is latched on and positioned properly while breastfeeding. If engorgement persists after 48 hours of following these recommendations, contact your health care provider or a lactation consultant. OVERALL HEALTH CARE RECOMMENDATIONS WHILE BREASTFEEDING  Eat healthy foods. Alternate between meals and snacks, eating 3 of each per day. Because what you eat affects your breast milk, some of the foods may make your baby more irritable than usual. Avoid eating these foods if you are sure that they are negatively affecting your baby.  Drink milk, fruit juice, and water to satisfy your thirst (about 10 glasses a day).  Rest often, relax, and continue to take  your prenatal vitamins to prevent fatigue, stress, and anemia.  Continue breast self-awareness checks.  Avoid chewing and smoking tobacco. Chemicals from cigarettes that pass into breast milk and exposure to secondhand smoke may harm your baby.  Avoid alcohol and drug use, including marijuana. Some medicines that may be harmful to your baby can pass through breast milk. It is important to ask your health care provider before taking any medicine, including all over-the-counter and prescription medicine as well as vitamin and herbal supplements. It is possible to become pregnant while breastfeeding. If birth control is desired, ask your health care provider about options that will be safe for your baby. SEEK MEDICAL CARE IF:  You feel like you want to stop breastfeeding or have become frustrated with breastfeeding.  You have painful breasts or nipples.  Your nipples are cracked or bleeding.  Your breasts are red, tender, or warm.  You have a swollen area on either breast.  You have a fever or chills.  You have nausea or vomiting.  You have drainage other than breast milk from your nipples.  Your breasts do not become full before feedings by the fifth day after you give birth.  You feel sad and depressed.  Your baby is too sleepy to eat well.  Your baby is having trouble sleeping.     Your baby is wetting less than 3 diapers in a 24-hour period.  Your baby has less than 3 stools in a 24-hour period.  Your baby's skin or the white part of his or her eyes becomes yellow.   Your baby is not gaining weight by 5 days of age. SEEK IMMEDIATE MEDICAL CARE IF:  Your baby is overly tired (lethargic) and does not want to wake up and feed.  Your baby develops an unexplained fever.   This information is not intended to replace advice given to you by your health care provider. Make sure you discuss any questions you have with your health care provider.   Document Released: 04/27/2005  Document Revised: 01/16/2015 Document Reviewed: 10/19/2012 Elsevier Interactive Patient Education 2016 [employer] Inc[employer]  

## 2015-12-25 NOTE — Addendum Note (Signed)
Addended by: Kathee DeltonHILLMAN, Mikailah Morel L on: 12/25/2015 01:51 PM   Modules accepted: Orders

## 2015-12-25 NOTE — Progress Notes (Signed)
Patient reports mid back pain that feels like burning [employer] interpreter [phone]#[id number]

## 2015-12-25 NOTE — Progress Notes (Signed)
Subjective:  Karen Howard is a 35 y.o. [id number] at 7923w4d being seen today for ongoing prenatal care.  She is currently monitored for the following issues for this high-risk pregnancy and has Supervision of normal pregnancy, antepartum; Advanced maternal age in multigravida; Sickle cell trait (HCC); and Grand multiparity on her problem list.  Patient reports no complaints.  Contractions: Not present. Vag. Bleeding: None.  Movement: Present. Denies leaking of fluid.   The following portions of the patient's history were reviewed and updated as appropriate: allergies, current medications, past family history, past medical history, past social history, past surgical history and problem list. Problem list updated.  Objective:   Vitals:   12/25/15 1243  BP: (!) 106/52  Pulse: 72  Weight: 181 lb (82.1 kg)    Fetal Status: Fetal Heart Rate (bpm): 144   Movement: Present     General:  Alert, oriented and cooperative. Patient is in no acute distress.  Skin: Skin is warm and dry. No rash noted.   Cardiovascular: Normal heart rate noted  Respiratory: Normal respiratory effort, no problems with respiration noted  Abdomen: Soft, gravid, appropriate for gestational age. Pain/Pressure: Present     Pelvic:  Cervical exam deferred        Extremities: Normal range of motion.  Edema: None  Mental Status: Normal mood and affect. Normal behavior. Normal judgment and thought content.   Urinalysis: Urine Protein: Negative Urine Glucose: Negative  Assessment and Plan:  Pregnancy: [id number] at 2623w4d  1. Supervision of normal pregnancy, antepartum, third trimester Continue prenatal care.  - Tdap vaccine greater than or equal to 7yo IM  2. Grand multiparity No h/o bleeding  3. Pruritus - Bile acids, total  Preterm labor symptoms and general obstetric precautions including but not limited to vaginal bleeding, contractions, leaking of fluid and fetal movement were reviewed in detail with the patient. Please  refer to After Visit Summary for other counseling recommendations.  Return in 2 weeks (on 01/08/2016).   Reva Boresanya S Davinci Glotfelty, MD'

## 2015-12-28 LAB — BILE ACIDS, TOTAL: Bile Acids Total: 9 umol/L (ref 0–19)

## 2016-01-03 ENCOUNTER — Other Ambulatory Visit (HOSPITAL_COMMUNITY): Payer: Self-pay | Admitting: Maternal and Fetal Medicine

## 2016-01-03 ENCOUNTER — Encounter (HOSPITAL_COMMUNITY): Payer: Self-pay

## 2016-01-03 ENCOUNTER — Ambulatory Visit (HOSPITAL_COMMUNITY)
Admission: RE | Admit: 2016-01-03 | Discharge: 2016-01-03 | Disposition: A | Discharge status: Home / Self Care | Payer: Medicaid Other | Source: Ambulatory Visit | Attending: Family | Admitting: Family

## 2016-01-03 VITALS — BP 111/62 | HR 76 | Wt 182.6 lb

## 2016-01-03 DIAGNOSIS — Z3A32 32 weeks gestation of pregnancy: Secondary | ICD-10-CM | POA: Insufficient documentation

## 2016-01-03 DIAGNOSIS — O09523 Supervision of elderly multigravida, third trimester: Secondary | ICD-10-CM | POA: Diagnosis not present

## 2016-01-03 DIAGNOSIS — Z36 Encounter for antenatal screening of mother: Secondary | ICD-10-CM | POA: Diagnosis not present

## 2016-01-03 DIAGNOSIS — O09513 Supervision of elderly primigravida, third trimester: Secondary | ICD-10-CM

## 2016-01-03 DIAGNOSIS — Z1389 Encounter for screening for other disorder: Secondary | ICD-10-CM

## 2016-01-03 DIAGNOSIS — O3660X0 Maternal care for excessive fetal growth, unspecified trimester, not applicable or unspecified: Secondary | ICD-10-CM

## 2016-01-08 ENCOUNTER — Encounter: Payer: Medicaid Other | Admitting: Advanced Practice Midwife

## 2016-01-18 ENCOUNTER — Encounter: Payer: Self-pay | Admitting: Obstetrics and Gynecology

## 2016-01-18 DIAGNOSIS — IMO0002 Reserved for concepts with insufficient information to code with codable children: Secondary | ICD-10-CM | POA: Insufficient documentation

## 2016-01-28 ENCOUNTER — Other Ambulatory Visit (HOSPITAL_COMMUNITY)
Admission: RE | Admit: 2016-01-28 | Discharge: 2016-01-28 | Disposition: A | Discharge status: Home / Self Care | Payer: Medicaid Other | Source: Ambulatory Visit | Attending: Certified Nurse Midwife | Admitting: Certified Nurse Midwife

## 2016-01-28 ENCOUNTER — Ambulatory Visit (INDEPENDENT_AMBULATORY_CARE_PROVIDER_SITE_OTHER): Payer: Medicaid Other | Admitting: Certified Nurse Midwife

## 2016-01-28 ENCOUNTER — Encounter: Payer: Self-pay | Admitting: Obstetrics and Gynecology

## 2016-01-28 VITALS — BP 121/57 | HR 72 | Wt 188.5 lb

## 2016-01-28 DIAGNOSIS — Z113 Encounter for screening for infections with a predominantly sexual mode of transmission: Secondary | ICD-10-CM | POA: Diagnosis present

## 2016-01-28 DIAGNOSIS — Z3483 Encounter for supervision of other normal pregnancy, third trimester: Secondary | ICD-10-CM

## 2016-01-28 DIAGNOSIS — IMO0002 Reserved for concepts with insufficient information to code with codable children: Secondary | ICD-10-CM

## 2016-01-28 NOTE — Progress Notes (Signed)
Subjective:  Karen Howard is a 35 y.o. [id number] at 792w3d being seen today for ongoing prenatal care.  She is currently monitored for the following issues for this low-risk pregnancy and has Supervision of normal pregnancy, antepartum; Advanced maternal age in multigravida; Sickle cell trait (HCC); Grand multiparity; and LGA (large for gestational age) fetus on her problem list.  Patient reports no complaints.  Contractions: Not present.  .  Movement: Present. Denies leaking of fluid.   The following portions of the patient's history were reviewed and updated as appropriate: allergies, current medications, past family history, past medical history, past social history, past surgical history and problem list. Problem list updated.  Objective:   Vitals:   01/28/16 0907  BP: (!) 121/57  Pulse: 72  Weight: 188 lb 8 oz (85.5 kg)    Fetal Status: Fetal Heart Rate (bpm): 145 Fundal Height: 36 cm Movement: Present  Presentation: Vertex  General:  Alert, oriented and cooperative. Patient is in no acute distress.  Skin: Skin is warm and dry. No rash noted.   Cardiovascular: Normal heart rate noted  Respiratory: Normal respiratory effort, no problems with respiration noted  Abdomen: Soft, gravid, appropriate for gestational age. Pain/Pressure: Present     Pelvic:       Cervical exam performed Dilation: 1 Effacement (%): 50 Station: -2  Extremities: Normal range of motion.  Edema: None  Mental Status: Normal mood and affect. Normal behavior. Normal judgment and thought content.   Urinalysis: Urine Protein: Negative Urine Glucose: Negative  Assessment and Plan:  Pregnancy: [id number] at 192w3d  1. Supervision of normal pregnancy, antepartum, third trimester - GBS culture  2. LGA (large for gestational age) fetus - >90%, AC >97% - f/u US later this week  Preterm labor symptoms and general obstetric precautions including but not limited to vaginal bleeding, contractions, leaking of fluid and fetal  movement were reviewed in detail with the patient. Please refer to After Visit Summary for other counseling recommendations.  Return in about 1 week (around 02/04/2016).   Donette LarryMelanie Frisco Cordts, CNM

## 2016-01-28 NOTE — Addendum Note (Signed)
Addended by: Faythe CasaBELLAMY, Cem Kosman M on: 01/28/2016 11:07 AM   Modules accepted: Orders

## 2016-01-29 LAB — GC/CHLAMYDIA PROBE AMP (~~LOC~~) NOT AT ARMC
CHLAMYDIA, DNA PROBE: NEGATIVE
NEISSERIA GONORRHEA: NEGATIVE

## 2016-01-30 LAB — OB RESULTS CONSOLE GBS: GBS: NEGATIVE

## 2016-01-30 LAB — CULTURE, BETA STREP (GROUP B ONLY)

## 2016-01-31 ENCOUNTER — Ambulatory Visit (HOSPITAL_COMMUNITY)
Admission: RE | Admit: 2016-01-31 | Discharge: 2016-01-31 | Disposition: A | Discharge status: Home / Self Care | Payer: Medicaid Other | Source: Ambulatory Visit | Attending: Family | Admitting: Family

## 2016-01-31 ENCOUNTER — Encounter (HOSPITAL_COMMUNITY): Payer: Self-pay

## 2016-01-31 ENCOUNTER — Other Ambulatory Visit (HOSPITAL_COMMUNITY): Payer: Self-pay | Admitting: *Deleted

## 2016-01-31 DIAGNOSIS — O3663X0 Maternal care for excessive fetal growth, third trimester, not applicable or unspecified: Secondary | ICD-10-CM | POA: Insufficient documentation

## 2016-01-31 DIAGNOSIS — Z3A36 36 weeks gestation of pregnancy: Secondary | ICD-10-CM | POA: Insufficient documentation

## 2016-01-31 DIAGNOSIS — O09523 Supervision of elderly multigravida, third trimester: Secondary | ICD-10-CM

## 2016-01-31 DIAGNOSIS — O3660X0 Maternal care for excessive fetal growth, unspecified trimester, not applicable or unspecified: Secondary | ICD-10-CM

## 2016-02-10 ENCOUNTER — Encounter (HOSPITAL_COMMUNITY): Payer: Self-pay | Admitting: Anesthesiology

## 2016-02-10 ENCOUNTER — Inpatient Hospital Stay (HOSPITAL_COMMUNITY)
Admission: AD | Admit: 2016-02-10 | Discharge: 2016-02-12 | DRG: 775 | Disposition: A | Discharge status: Home / Self Care | Payer: Medicaid Other | Source: Ambulatory Visit | Attending: Obstetrics and Gynecology | Admitting: Obstetrics and Gynecology

## 2016-02-10 ENCOUNTER — Encounter (HOSPITAL_COMMUNITY): Payer: Self-pay | Admitting: *Deleted

## 2016-02-10 DIAGNOSIS — O3663X0 Maternal care for excessive fetal growth, third trimester, not applicable or unspecified: Secondary | ICD-10-CM | POA: Diagnosis present

## 2016-02-10 DIAGNOSIS — D573 Sickle-cell trait: Secondary | ICD-10-CM | POA: Diagnosis present

## 2016-02-10 DIAGNOSIS — O9902 Anemia complicating childbirth: Secondary | ICD-10-CM | POA: Diagnosis present

## 2016-02-10 DIAGNOSIS — Z3483 Encounter for supervision of other normal pregnancy, third trimester: Secondary | ICD-10-CM | POA: Diagnosis present

## 2016-02-10 DIAGNOSIS — Z3A38 38 weeks gestation of pregnancy: Secondary | ICD-10-CM

## 2016-02-10 LAB — CBC
HCT: 32.9 % — ABNORMAL LOW (ref 36.0–46.0)
HEMOGLOBIN: 11.5 g/dL — AB (ref 12.0–15.0)
MCH: 27.9 pg (ref 26.0–34.0)
MCHC: 35 g/dL (ref 30.0–36.0)
MCV: 79.9 fL (ref 78.0–100.0)
Platelets: 213 10*3/uL (ref 150–400)
RBC: 4.12 MIL/uL (ref 3.87–5.11)
RDW: 13.5 % (ref 11.5–15.5)
WBC: 12.7 10*3/uL — ABNORMAL HIGH (ref 4.0–10.5)

## 2016-02-10 LAB — TYPE AND SCREEN
ABO/RH(D): A POS
Antibody Screen: NEGATIVE

## 2016-02-10 LAB — ABO/RH: ABO/RH(D): A POS

## 2016-02-10 MED ORDER — SODIUM CHLORIDE 0.9% FLUSH: 3.0000 mL | INTRAVENOUS | Status: DC | PRN

## 2016-02-10 MED ORDER — PRENATAL MULTIVITAMIN CH
1.0000 | ORAL_TABLET | Freq: Every day | ORAL | Status: DC
Start: 2016-02-10 — End: 2016-02-12
  Administered 2016-02-10 – 2016-02-12 (×3): 1 via ORAL
  Filled 2016-02-10 (×3): qty 1

## 2016-02-10 MED ORDER — OXYTOCIN 40 UNITS IN LACTATED RINGERS INFUSION - SIMPLE MED
INTRAVENOUS | Status: AC
  Administered 2016-02-10: 500 mL via INTRAVENOUS
  Filled 2016-02-10: qty 1000

## 2016-02-10 MED ORDER — COCONUT OIL OIL
1.0000 "application " | TOPICAL_OIL | Status: DC | PRN
  Filled 2016-02-10: qty 120

## 2016-02-10 MED ORDER — IBUPROFEN 600 MG PO TABS
600.0000 mg | ORAL_TABLET | Freq: Four times a day (QID) | ORAL | Status: DC
  Administered 2016-02-10 – 2016-02-12 (×8): 600 mg via ORAL
  Filled 2016-02-10 (×9): qty 1

## 2016-02-10 MED ORDER — ONDANSETRON HCL 4 MG/2ML IJ SOLN: 4.0000 mg | INTRAMUSCULAR | Status: DC | PRN

## 2016-02-10 MED ORDER — LACTATED RINGERS IV SOLN: 500.0000 mL | INTRAVENOUS | Status: DC | PRN

## 2016-02-10 MED ORDER — OXYCODONE-ACETAMINOPHEN 5-325 MG PO TABS: 2.0000 | ORAL_TABLET | ORAL | Status: DC | PRN

## 2016-02-10 MED ORDER — SODIUM CHLORIDE 0.9% FLUSH: 3.0000 mL | Freq: Two times a day (BID) | INTRAVENOUS | Status: DC

## 2016-02-10 MED ORDER — LIDOCAINE HCL (PF) 1 % IJ SOLN
30.0000 mL | INTRAMUSCULAR | Status: AC | PRN
  Administered 2016-02-10: 30 mL via SUBCUTANEOUS
  Filled 2016-02-10: qty 30

## 2016-02-10 MED ORDER — FLEET ENEMA 7-19 GM/118ML RE ENEM: 1.0000 | ENEMA | RECTAL | Status: DC | PRN

## 2016-02-10 MED ORDER — TETANUS-DIPHTH-ACELL PERTUSSIS 5-2.5-18.5 LF-MCG/0.5 IM SUSP: 0.5000 mL | Freq: Once | INTRAMUSCULAR | Status: DC

## 2016-02-10 MED ORDER — OXYTOCIN 40 UNITS IN LACTATED RINGERS INFUSION - SIMPLE MED
2.5000 [IU]/h | INTRAVENOUS | Status: DC
  Administered 2016-02-10: 2.5 [IU]/h via INTRAVENOUS

## 2016-02-10 MED ORDER — ACETAMINOPHEN 325 MG PO TABS: 650.0000 mg | ORAL_TABLET | ORAL | Status: DC | PRN

## 2016-02-10 MED ORDER — OXYTOCIN BOLUS FROM INFUSION
500.0000 mL | Freq: Once | INTRAVENOUS | Status: AC
  Administered 2016-02-10: 500 mL via INTRAVENOUS

## 2016-02-10 MED ORDER — SODIUM CHLORIDE 0.9 % IV SOLN: 250.0000 mL | INTRAVENOUS | Status: DC | PRN

## 2016-02-10 MED ORDER — SENNOSIDES-DOCUSATE SODIUM 8.6-50 MG PO TABS
2.0000 | ORAL_TABLET | ORAL | Status: DC
  Administered 2016-02-10 – 2016-02-11 (×2): 2 via ORAL
  Filled 2016-02-10 (×2): qty 2

## 2016-02-10 MED ORDER — ZOLPIDEM TARTRATE 5 MG PO TABS: 5.0000 mg | ORAL_TABLET | Freq: Every evening | ORAL | Status: DC | PRN

## 2016-02-10 MED ORDER — BENZOCAINE-MENTHOL 20-0.5 % EX AERO
1.0000 "application " | INHALATION_SPRAY | CUTANEOUS | Status: DC | PRN
  Filled 2016-02-10: qty 56

## 2016-02-10 MED ORDER — ONDANSETRON HCL 4 MG/2ML IJ SOLN: 4.0000 mg | Freq: Four times a day (QID) | INTRAMUSCULAR | Status: DC | PRN

## 2016-02-10 MED ORDER — MISOPROSTOL 200 MCG PO TABS
ORAL_TABLET | ORAL | Status: AC
  Filled 2016-02-10: qty 3

## 2016-02-10 MED ORDER — LACTATED RINGERS IV SOLN
INTRAVENOUS | Status: DC
  Administered 2016-02-10: 09:00:00 via INTRAVENOUS

## 2016-02-10 MED ORDER — OXYCODONE-ACETAMINOPHEN 5-325 MG PO TABS: 1.0000 | ORAL_TABLET | ORAL | Status: DC | PRN

## 2016-02-10 MED ORDER — DIPHENHYDRAMINE HCL 25 MG PO CAPS: 25.0000 mg | ORAL_CAPSULE | Freq: Four times a day (QID) | ORAL | Status: DC | PRN

## 2016-02-10 MED ORDER — ONDANSETRON HCL 4 MG PO TABS: 4.0000 mg | ORAL_TABLET | ORAL | Status: DC | PRN

## 2016-02-10 MED ORDER — SOD CITRATE-CITRIC ACID 500-334 MG/5ML PO SOLN: 30.0000 mL | ORAL | Status: DC | PRN

## 2016-02-10 MED ORDER — SIMETHICONE 80 MG PO CHEW: 80.0000 mg | CHEWABLE_TABLET | ORAL | Status: DC | PRN

## 2016-02-10 MED ORDER — MEASLES, MUMPS & RUBELLA VAC ~~LOC~~ INJ
0.5000 mL | INJECTION | Freq: Once | SUBCUTANEOUS | Status: DC
  Filled 2016-02-10: qty 0.5

## 2016-02-10 NOTE — Lactation Note (Signed)
This note was copied from a baby's chart. Lactation Consultation Note  Patient Name: Boy Zandra AbtsSara Hyland MVHQI'OToday's Date: 02/10/2016 Reason for consult: Initial assessment [place] interpreter used for consult.  Mother's language is Swahili.  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  FOB asking what she will do when she goes to work.  [facility] and obtaining a pump use at work will allow her to continue to provide breastmilk for baby.  Observed mom latch baby with ease.  She tends to allow baby to slip to nipple so instructed on holding baby close during feeding.  Instructed to feed with any cue and call for assist as needed.  Discussed colostrum present now for baby.  Maternal Data Has patient been taught Hand Expression?: Yes Does the patient have breastfeeding experience prior to this delivery?: Yes  Feeding Feeding Type: Breast Fed Length of feed: 20 min (minutes)  LATCH Score/Interventions Latch: Grasps breast easily, tongue down, lips flanged, rhythmical sucking.  Audible Swallowing: A few with stimulation Intervention(s): Skin to skin  Type of Nipple: Everted at rest and after stimulation  Comfort (Breast/Nipple): Soft / non-tender     Hold (Positioning): No assistance needed to correctly position infant at breast.  LATCH Score: 9  Lactation Tools Discussed/Used     Consult Status      Huston FoleyMOULDEN, Jahmier Willadsen S 02/10/2016, 12:13 PM

## 2016-02-10 NOTE — H&P (Signed)
LABOR AND DELIVERY ADMISSION HISTORY AND PHYSICAL NOTE  Karen Howard is a 35 y.o. female [phone] with IUP at [redacted]w[redacted]d by LMP confirmed with 20wk Ultrasound presenting for Advanced labor.   She reports positive fetal movement. She denies leakage of fluid or vaginal bleeding.  Prenatal History/Complications:  Past Medical History: No past medical history on file.  Past Surgical History: No past surgical history on file.  Obstetrical History: OB History    Gravida Para Term Preterm AB Living   9 8 8     8    SAB TAB Ectopic Multiple Live Births         0 8      Social History: Social History   Social History  . Marital status: Married    Spouse name: N/A  . Number of children: N/A  . Years of education: N/A   Social History Main Topics  . Smoking status: Never Smoker  . Smokeless tobacco: Never Used  . Alcohol use No  . Drug use: No  . Sexual activity: Not on file   Other Topics Concern  . Not on file   Social History Narrative  . No narrative on file    Family History: No family history on file.  Allergies: No Known Allergies  Prescriptions Prior to Admission  Medication Sig Dispense Refill Last Dose  . Prenatal Vit-Fe Fumarate-FA (PRENATAL MULTIVITAMIN) TABS tablet Take 1 tablet by mouth daily at 12 noon. 90 tablet 2 Taking     Review of Systems   All systems reviewed and negative except as stated in HPI  Blood pressure 121/81, pulse 85, temperature 97.8 F (36.6 C), temperature source Oral, resp. rate 20, last menstrual period 05/18/2015, SpO2 98 %, unknown if currently breastfeeding. General appearance: alert, cooperative and appears stated age Lungs: clear to auscultation bilaterally Heart: regular rate and rhythm Abdomen: soft, non-tender; bowel sounds normal Extremities: No calf swelling or tenderness Presentation: cephalic by cervical exam Fetal monitoring: Category 2 advanced labor Uterine activity: q2 minutes Dilation: Lip/rim Effacement (%):  60 Station: -1 Exam by:: Zerita Boers, CNM   Prenatal labs: ABO, Rh: A/POS/-- (07/19 1478) Antibody: NEG (07/19 0905) Rubella: !Error! RPR: NON REAC (07/19 0905)  HBsAg: NEGATIVE (07/19 0905)  HIV: NONREACTIVE (07/19 0905)  GBS: Negative (09/21 0000)  1 hr Glucola: 115 Anatomy US: normal  Prenatal Transfer Tool  Maternal Diabetes: No Genetic Screening: Declined Maternal Ultrasounds/Referrals: Normal Fetal Ultrasounds or other Referrals:  None Maternal Substance Abuse:  No Significant Maternal Medications:  None Significant Maternal Lab Results: Lab values include: Group B Strep negative  Results for orders placed or performed during the hospital encounter of 02/10/16 (from the past 24 hour(s))  CBC   Collection Time: 02/10/16  8:16 AM  Result Value Ref Range   WBC 12.7 (H) 4.0 - 10.5 K/uL   RBC 4.12 3.87 - 5.11 MIL/uL   Hemoglobin 11.5 (L) 12.0 - 15.0 g/dL   HCT 29.5 (L) 62.1 - 30.8 %   MCV 79.9 78.0 - 100.0 fL   MCH 27.9 26.0 - 34.0 pg   MCHC 35.0 30.0 - 36.0 g/dL   RDW 65.7 84.6 - 96.2 %   Platelets 213 150 - 400 K/uL    Patient Active Problem List   Diagnosis Date Noted  . Normal labor 02/10/2016  . LGA (large for gestational age) fetus 01/18/2016  . Grand multiparity 12/25/2015  . Sickle cell trait (HCC) 12/06/2015  . Supervision of normal pregnancy, antepartum 11/27/2015  . Advanced maternal  age in multigravida 11/27/2015    Assessment: Karen Howard is a 35 y.o. [id number] at 2973w2d here for Advance labor.  #Labor:expectant management #Pain: To advanced for IV pain meds or epidural #FWB: Category 2 - advanced labor expect imminent delivery. #ID:  GBS negative #MOF: breast #MOC:undecided #Circ:  Yes outpatient.  Ernestina Pennaicholas Kendle Erker 02/10/2016, 9:15 AM

## 2016-02-11 ENCOUNTER — Encounter: Payer: Medicaid Other | Admitting: Obstetrics & Gynecology

## 2016-02-11 LAB — SYPHILIS: RPR W/REFLEX TO RPR TITER AND TREPONEMAL ANTIBODIES, TRADITIONAL SCREENING AND DIAGNOSIS ALGORITHM: RPR Ser Ql: NONREACTIVE

## 2016-02-11 MED ORDER — INFLUENZA VAC SPLIT QUAD 0.5 ML IM SUSY
0.5000 mL | PREFILLED_SYRINGE | INTRAMUSCULAR | Status: AC
  Administered 2016-02-12: 0.5 mL via INTRAMUSCULAR
  Filled 2016-02-11: qty 0.5

## 2016-02-11 NOTE — Progress Notes (Signed)
Patient ID: Karen AbtsSara Emmitt, female   DOB: 02/22/1981, 35 y.o.   MRN: [id number]   POSTPARTUM PROGRESS NOTE  Post Partum Day 1 Subjective:  Karen Howard is a 35 y.o. [phone] 2154w2d s/p vacuum assisted delivery.  No acute events overnight.  Pt denies problems with ambulating, voiding or po intake.  She denies nausea or vomiting.  Pain is well controlled.  She has had flatus. She has had bowel movement.  Lochia Small.   Objective: Blood pressure (!) 103/53, pulse 66, temperature 98.1 F (36.7 C), temperature source Oral, resp. rate 18, height 5\' 7"  (1.702 m), weight 87.1 kg (192 lb), last menstrual period 05/18/2015, SpO2 100 %, unknown if currently breastfeeding.  Physical Exam:  General: alert, cooperative and no distress Lochia:normal flow Chest: CTAB Heart: RRR no m/r/g Abdomen: +BS, soft, nontender,  Uterine Fundus: firm, non-tender DVT Evaluation: No calf swelling or tenderness Extremities: without edema   Recent Labs  02/10/16 0816  HGB 11.5*  HCT 32.9*    Assessment/Plan:  ASSESSMENT: Karen Howard is a [age] y.o. [id number] 3454w2d s/p vacuum assisted delivery  Plan for discharge tomorrow   LOS: 1 day   Tillman Sersngela C Riccio, DO 02/11/2016, 7:26 AM   OB FELLOW POSTPARTUM PROGRESS NOTE ATTESTATION  I have seen and examined this patient and agree with above documentation in the resident's note.   Ernestina PennaNicholas Schenk, MD 7:42 AM

## 2016-02-12 MED ORDER — IBUPROFEN 600 MG PO TABS: 600.0000 mg | ORAL_TABLET | Freq: Four times a day (QID) | ORAL | 0 refills | Status: DC

## 2016-02-12 NOTE — Clinical Social Work Maternal (Signed)
CLINICAL SOCIAL WORK MATERNAL/CHILD NOTE  Patient Details  Name: Karen Howard MRN: [id number] Date of Birth: Jul 09, 1980  Date:  02/12/2016  Clinical Social Worker Initiating Note:  Laurey Arrow Date/ Time Initiated:  02/11/16/1500     Child's Name:  Karen Howard   Legal Guardian:  Mother   Need for Interpreter:  Swahili   Date of Referral:  02/10/16     Reason for Referral:  Other (Comment) (New to the [place] with limited resources. )   Referral Source:  [employer]   Address:  [address]  Phone number:  [phone]   Household Members:  Self, Adult Children, Minor Children, Spouse; There are a total of 11 people living in 2, two bedroom apartments that are side by side.   Natural Supports (not living in the home):  Immediate Family, Community (Chartered loss adjuster)   Professional Supports: Organized support group (Comment), Case Metallurgist (Chartered loss adjuster)   Employment: Unemployed; FOB is employed and provides financial support to the family.    Type of Work:     Education:      Pensions consultant:   (No Scientist, product/process development. )   Other Resources:  Physicist, medical , [place]   Cultural/Religious Considerations Which May Impact Care:  None Reported  Strengths:  Ability to meet basic needs , Pediatrician chosen    Risk Factors/Current Problems:  Other (Comment) (Adjustments to the [place])   Cognitive State:  Alert , Insightful    Mood/Affect:  Happy , Bright , Interested , Comfortable , Relaxed    CSW Assessment:       CSW met with [name] to complete an assessment for a consult for new immigrants from [place].  [name] speaks no [place] and FOB speaks very little [place].  Both parents were engaged and interested in with CSW.  [name] gave CSW permission to meet with [name] while FOB was present. With the use of [place] via telephone, CSW inquired about the family's supports.  The parents informed CSW that the family receives support and  case management through the [employer]  FOB communicated that the family has 9 children (ages: newborn, 2, 70,7,9,14,16,18,21, and one grandchild age 37). [name] is not employed and the family is financially supported by FOB Maretta Los). The family has a car seat for infant, but does not have a safe place for the infant to sleep.  CSW educated the family about Safe Sleep and SIDS.  Both parents had several questions, and their initial plan was to have the infant to co-sleep with [name].  CSW review risk/consequences regarding safe sleep (SIDS) and the parents appeared understanding.  [name] communicated to CSW that for the past 37 years, [name] has allowed her children to co-sleep with [name].  CSW reviewed safe sleep options and FOB communicated that the family will ask the [place] [employer] for assistance with obtaining a pack and play. The family had numerous questions regarding SIDS but after CSW education appeared to have a better understanding. The family reports having limited supplies for the infant, and CSW requested a bundle pack for the family.  The family will also be connected with [facility] for in-home services. FOB expressed to CSW that FOB will not be able to transport [name] and infant to infant's first appointment on Thursday, 02/13/16 at 10:30am and requested a later appointment time.  CSW informed [name] and FOB that CSW with inform CN of family's concerns and CN will adjust the infant's pediatric appointment (CSW met  with CN staff, Stanton Kidney, and requested infant's appointment to be changed to a later time.  CN staff will follow up with [name] when appointment time is changed).  CSW thanked the parents for meeting with CSW and provided the family with CSW contact information.  The family did not have any questions or concerns at this time.   There are no barriers to dc.                                                                                                                                                                                         CSW Plan/Description:  Information/Referral to [employer] , [employer] , No Further Intervention Required/No Barriers to Discharge   Laurey Arrow, MSW, LCSW Clinical Social Work [phone]    Dimple Nanas, LCSW 02/12/2016, 8:54 AM

## 2016-02-12 NOTE — Discharge Instructions (Signed)

## 2016-02-12 NOTE — Discharge Summary (Signed)
OB Discharge Summary  Patient Name: Karen Howard DOB: Jan 25, 1981 MRN: [id number]  Date of admission: 02/10/2016 Delivering MD: Ernestina Penna MICHAEL   Date of discharge: 02/12/2016  Admitting diagnosis: CTX Intrauterine pregnancy: [redacted]w[redacted]d     Secondary diagnosis:Active Problems:   Normal labor  Additional problems: Vacuum-assisted delivery due to fetal bradycardia.    Discharge diagnosis: Term Pregnancy Delivered                                                                     Post partum procedures:none  Augmentation: none  Complications: None  Hospital course:  Onset of Labor With Vaginal Delivery     35 y.o. yo [phone] at [redacted]w[redacted]d was admitted in Active Labor on 02/10/2016. Patient had an uncomplicated labor course as follows:  Membrane Rupture Time/Date: 8:11 AM ,02/10/2016   Intrapartum Procedures: Episiotomy: None [1]                                         Lacerations:  1st degree [2]  Patient had a delivery of a Viable infant. 02/10/2016  Information for the patient's newborn:  Nance, Mccombs [[id number]]  Delivery Method: Vaginal, Spontaneous Delivery (Filed from Delivery Summary)    Pateint had an uncomplicated postpartum course.  She is ambulating, tolerating a regular diet, passing flatus, and urinating well. Patient is discharged home in stable condition on 02/12/16.    Physical exam Vitals:   02/10/16 1616 02/10/16 2222 02/11/16 0533 02/12/16 0559  BP: (!) 104/49 (!) 112/52 (!) 103/53 (!) 103/57  Pulse: (!) 59 63 66 67  Resp: 20 16 18 18   Temp: 98.4 F (36.9 C) 98.4 F (36.9 C) 98.1 F (36.7 C) 98.4 F (36.9 C)  TempSrc:  Oral Oral   SpO2: 100% 100%    Weight:      Height:       General: alert, cooperative and no distress Lochia: appropriate Uterine Fundus: firm Incision: N/A DVT Evaluation: No evidence of DVT seen on physical exam. Labs: Lab Results  Component Value Date   WBC 12.7 (H) 02/10/2016   HGB 11.5 (L) 02/10/2016   HCT 32.9 (L)  02/10/2016   MCV 79.9 02/10/2016   PLT 213 02/10/2016   CMP Latest Ref Rng & Units 09/08/2015  Glucose 65 - 99 mg/dL 96  BUN 6 - 20 mg/dL <1(H)  Creatinine 0.86 - 1.00 mg/dL 5.78  Sodium 469 - 629 mmol/L 137  Potassium 3.5 - 5.1 mmol/L 4.3  Chloride 101 - 111 mmol/L 106  CO2 22 - 32 mmol/L 22  Calcium 8.9 - 10.3 mg/dL 8.9  Total Protein 6.5 - 8.1 g/dL 7.0  Total Bilirubin 0.3 - 1.2 mg/dL 0.8  Alkaline Phos 38 - 126 U/L 29(L)  AST 15 - 41 U/L 17  ALT 14 - 54 U/L 10(L)    Discharge instruction: per After Visit Summary and "Baby and Me Booklet".  Diet: low salt diet  Activity: Advance as tolerated. Pelvic rest for 6 weeks.   Outpatient follow up:6 weeks Follow up Appt:Future Appointments Date Time Provider Department Center  02/21/2016 3:00 PM [facility] [place] 3 [facility]  03/16/2016 10:20 AM Hiram ComberElizabeth Woodland Mumaw, DO [facility]   Follow up visit: No Follow-up on file.  Postpartum contraception: Depo Provera  Newborn Data: Live born female  Birth Weight: 8 lb 3.8 oz (3735 g) APGAR: 8, 9  Baby Feeding: Breast Disposition:home with mother   02/12/2016 Hilton SinclairKaty D Mayo, MD  OB FELLOW DISCHARGE ATTESTATION  I have seen and examined this patient and agree with above documentation in the resident's note.   Silvano BilisNoah B Wouk, MD 2:08 PM

## 2016-02-18 ENCOUNTER — Encounter: Payer: Medicaid Other | Admitting: Obstetrics & Gynecology

## 2016-02-21 ENCOUNTER — Encounter (HOSPITAL_COMMUNITY): Payer: Self-pay

## 2016-02-21 ENCOUNTER — Ambulatory Visit (HOSPITAL_COMMUNITY)
Admission: RE | Admit: 2016-02-21 | Discharge: 2016-02-21 | Disposition: A | Discharge status: Home / Self Care | Payer: Medicaid Other | Source: Ambulatory Visit | Attending: Family | Admitting: Family

## 2016-02-21 DIAGNOSIS — Z641 Problems related to multiparity: Secondary | ICD-10-CM

## 2016-02-21 DIAGNOSIS — D573 Sickle-cell trait: Secondary | ICD-10-CM

## 2016-02-21 DIAGNOSIS — O09523 Supervision of elderly multigravida, third trimester: Secondary | ICD-10-CM

## 2016-03-16 ENCOUNTER — Ambulatory Visit: Payer: Medicaid Other | Admitting: Family Medicine

## 2016-04-06 ENCOUNTER — Encounter: Payer: Self-pay | Admitting: *Deleted

## 2016-04-06 ENCOUNTER — Ambulatory Visit: Payer: Medicaid Other | Admitting: Certified Nurse Midwife

## 2016-04-15 ENCOUNTER — Telehealth: Payer: Self-pay | Admitting: *Deleted

## 2016-04-15 NOTE — Telephone Encounter (Signed)
Called  And  Made  appointment [phone].18,2017 Monday 1040 am . Client was given in information. [address] RN CN [phone].

## 2016-04-27 ENCOUNTER — Ambulatory Visit (INDEPENDENT_AMBULATORY_CARE_PROVIDER_SITE_OTHER): Payer: Self-pay | Admitting: Medical

## 2016-04-27 ENCOUNTER — Encounter: Payer: Self-pay | Admitting: Medical

## 2016-04-27 NOTE — Patient Instructions (Signed)
Oral Contraception Information Oral contraceptive pills (OCPs) are medicines taken to prevent pregnancy. OCPs work by preventing the ovaries from releasing eggs. The hormones in OCPs also cause the cervical mucus to thicken, preventing the sperm from entering the uterus. The hormones also cause the uterine lining to become thin, not allowing a fertilized egg to attach to the inside of the uterus. OCPs are highly effective when taken exactly as prescribed. However, OCPs do not prevent sexually transmitted diseases (STDs). Safe sex practices, such as using condoms along with the pill, can help prevent STDs.  Before taking the pill, you may have a physical exam and Pap test. Your health care provider may order blood tests. The health care provider will make sure you are a good candidate for oral contraception. Discuss with your health care provider the possible side effects of the OCP you may be prescribed. When starting an OCP, it can take 2 to 3 months for the body to adjust to the changes in hormone levels in your body.  TYPES OF ORAL CONTRACEPTION  The combination pill-This pill contains estrogen and progestin (synthetic progesterone) hormones. The combination pill comes in 21-day, 28-day, or 91-day packs. Some types of combination pills are meant to be taken continuously (365-day pills). With 21-day packs, you do not take pills for 7 days after the last pill. With 28-day packs, the pill is taken every day. The last 7 pills are without hormones. Certain types of pills have more than 21 hormone-containing pills. With 91-day packs, the first 84 pills contain both hormones, and the last 7 pills contain no hormones or contain estrogen only.  The minipill-This pill contains the progesterone hormone only. The pill is taken every day continuously. It is very important to take the pill at the same time each day. The minipill comes in packs of 28 pills. All 28 pills contain the hormone.  ADVANTAGES OF ORAL  CONTRACEPTIVE PILLS  Decreases premenstrual symptoms.   Treats menstrual period cramps.   Regulates the menstrual cycle.   Decreases a heavy menstrual flow.   May treatacne, depending on the type of pill.   Treats abnormal uterine bleeding.   Treats polycystic ovarian syndrome.   Treats endometriosis.   Can be used as emergency contraception.  THINGS THAT CAN MAKE ORAL CONTRACEPTIVE PILLS LESS EFFECTIVE OCPs can be less effective if:   You forget to take the pill at the same time every day.   You have a stomach or intestinal disease that lessens the absorption of the pill.   You take OCPs with other medicines that make OCPs less effective, such as antibiotics, certain HIV medicines, and some seizure medicines.   You take expired OCPs.   You forget to restart the pill on day 7, when using the packs of 21 pills.  RISKS ASSOCIATED WITH ORAL CONTRACEPTIVE PILLS  Oral contraceptive pills can sometimes cause side effects, such as:  Headache.  Nausea.  Breast tenderness.  Irregular bleeding or spotting. Combination pills are also associated with a small increased risk of:  Blood clots.  Heart attack.  Stroke. This information is not intended to replace advice given to you by your health care provider. Make sure you discuss any questions you have with your health care provider. Document Released: 07/18/2002 Document Revised: 08/19/2015 Document Reviewed: 10/16/2012 Elsevier Interactive Patient Education  2017 Elsevier Inc.  

## 2016-04-27 NOTE — Progress Notes (Signed)
Subjective:     Karen Howard is a 35 y.o. female who presents for a postpartum visit. She is 6 weeks postpartum following a delivery 02/10/2016. I have fully reviewed the prenatal and intrapartum course. The delivery was at 38 gestational weeks. Outcome: vaginal. Anesthesia: local. Postpartum course has been uncomplicated. Baby's course has been uncomplicated. Baby is feeding by breast/bottle. Bleeding not at this time. Bowel function is normal. Bladder function is normal. Patient is sexually active. Contraception method is none at this time, but desire birth control pills.. Postpartum depression screening: negative  The following portions of the patient's history were reviewed and updated as appropriate: allergies, current medications, past family history, past medical history, past social history, past surgical history and problem list.  Review of Systems Pertinent items are noted in HPI.   Objective:    BP 122/71   Pulse 75   Wt 165 lb (74.8 kg)   BMI 25.84 kg/m   General:  alert and cooperative   Breasts:  not performed  Lungs: clear to auscultation bilaterally  Heart:  regular rate and rhythm, S1, S2 normal, no murmur, click, rub or gallop  Abdomen: soft, non-tender; bowel sounds normal; no masses,  no organomegaly   Vulva:  not evaluated  Vagina: not evaluated  Cervix:  not evaluated  Corpus: not examined  Adnexa:  not evaluated  Rectal Exam: Not performed.        Assessment:     Normal postpartum exam. Pap smear not done at today's visit.  Negative during pregnancy   Plan:    1. Contraception: desires Progesterone only OCPs 2. Abstinence or condom use advised x 2 weeks. If negative pregnancy test in 2 weeks, will send Rx for Micronur  3. Follow up in: 2 weeks for UPT and birth control initiation or sooner as needed.    Marny LowensteinJulie N Favio Moder, PA-C  04/27/2016 10:57 AM

## 2016-05-12 ENCOUNTER — Ambulatory Visit (INDEPENDENT_AMBULATORY_CARE_PROVIDER_SITE_OTHER): Payer: Medicaid Other | Admitting: *Deleted

## 2016-05-12 DIAGNOSIS — Z3202 Encounter for pregnancy test, result negative: Secondary | ICD-10-CM | POA: Diagnosis not present

## 2016-05-12 DIAGNOSIS — Z30011 Encounter for initial prescription of contraceptive pills: Secondary | ICD-10-CM

## 2016-05-12 LAB — POCT PREGNANCY, URINE: Preg Test, Ur: NEGATIVE

## 2016-05-12 MED ORDER — NORETHINDRONE 0.35 MG PO TABS: 1.0000 | ORAL_TABLET | Freq: Every day | ORAL | 11 refills | Status: DC

## 2016-05-12 NOTE — Progress Notes (Signed)
Patient here for pregnancy test. If no intercourse or protected intercourse and upt negative may start birth control pill per Vonzella NippleJulie Wenzel, PA notes from last visit. Used pacifica interpreter [phone] and verified [name] has not had intercourse since her postpartum visit 04/27/16. She is breastfeeding. UPT completed and is negative - will send rx to her pharmacy.Also explained should use condoms or abstain until on 2nd pack of pills and should take pill same time everyday. She voices understanding.

## 2016-05-27 IMAGING — US US OB LIMITED
1 series · 14 of 18 positions shown · non-contrast
Comparison: none

ADDENDUM:
This is a correction to the original report.
CLINICAL DATA: Decreased fetal movement

EXAM:
LIMITED OBSTETRIC ULTRASOUND

[Series 1: us ob limited · 0.20mm/px · 14 of 18 slices shown]
[im 1/18]
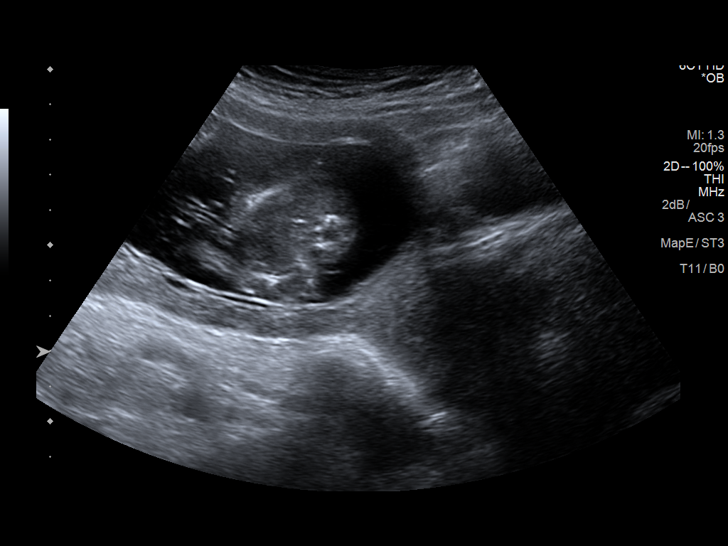
[im 2/18]
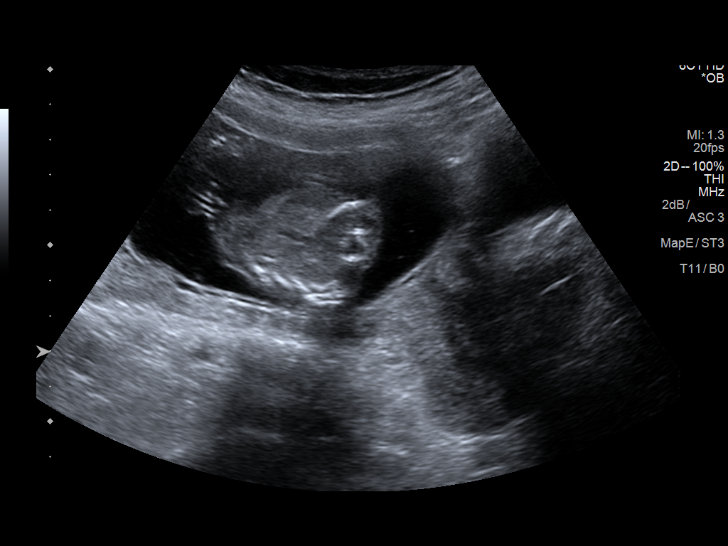
[im 4/18]
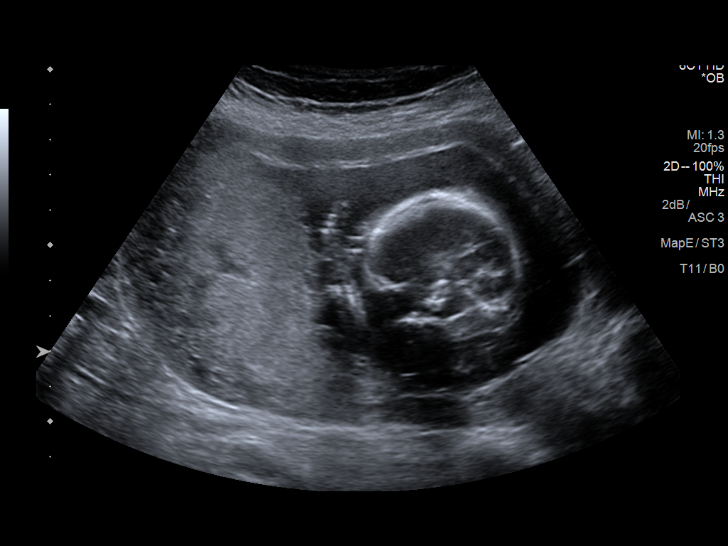
[im 5/18]
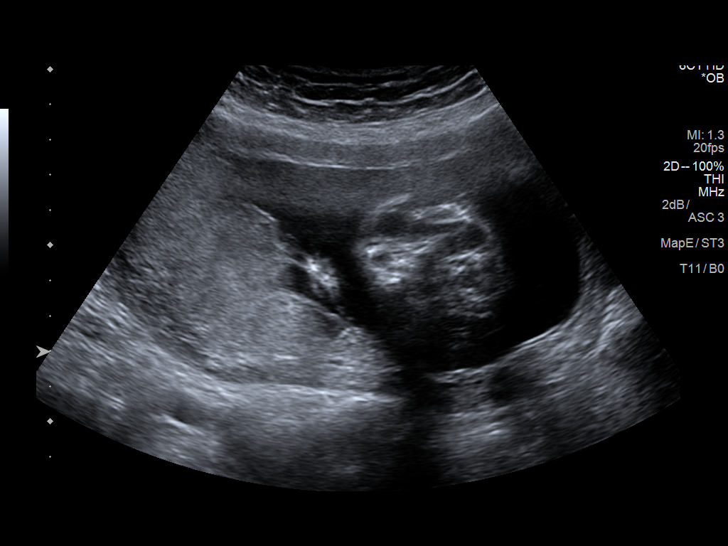
[im 6/18]
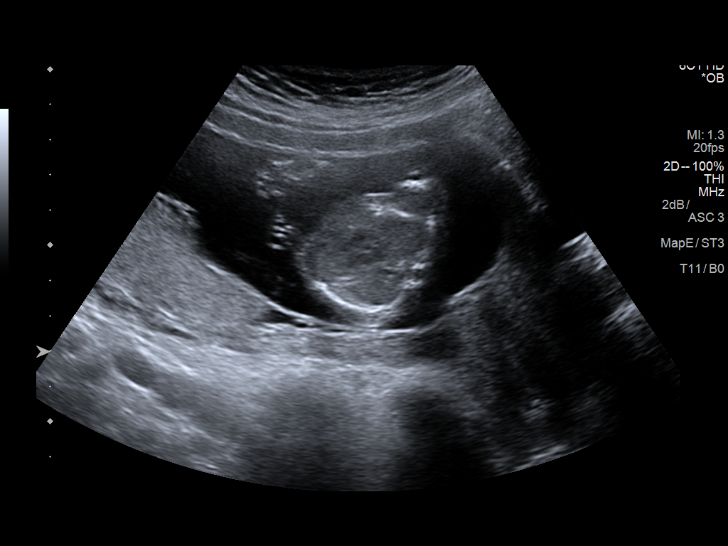
[im 8/18]
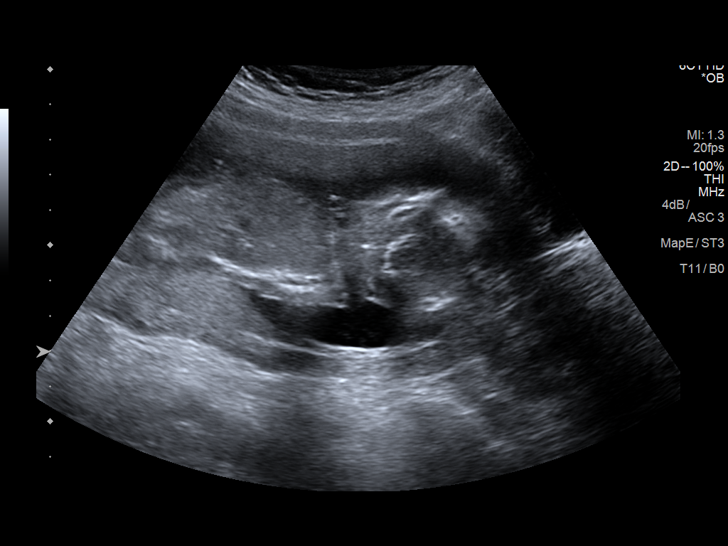
[im 9/18]
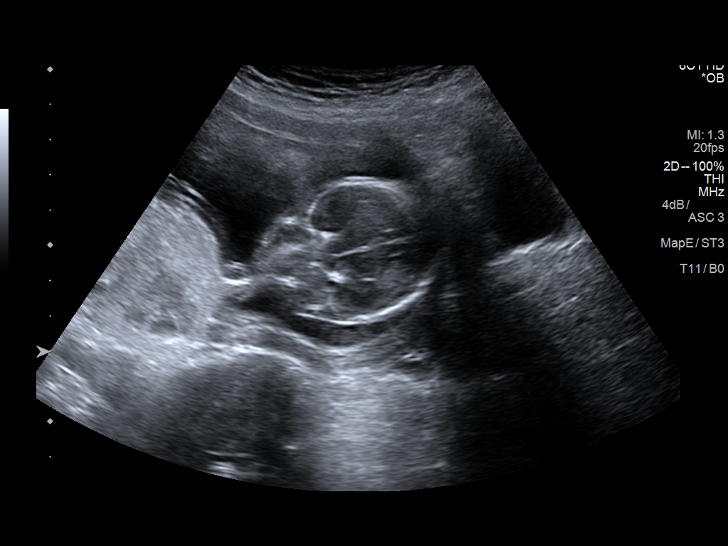
[im 10/18]
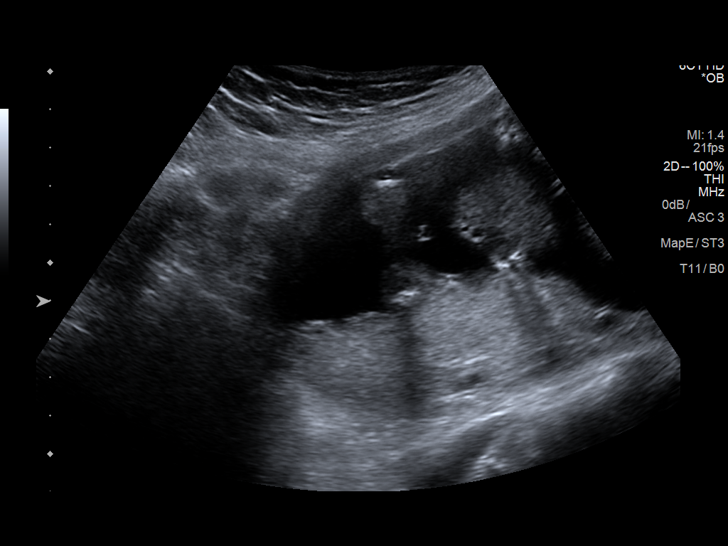
[im 11/18]
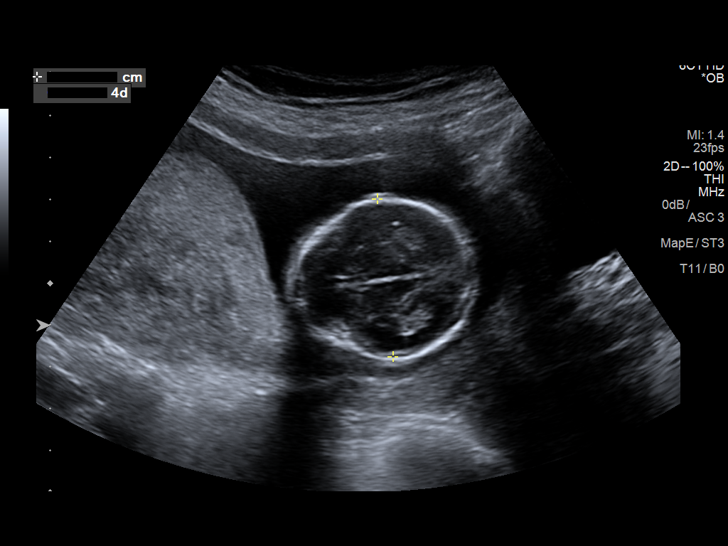
[im 13/18]
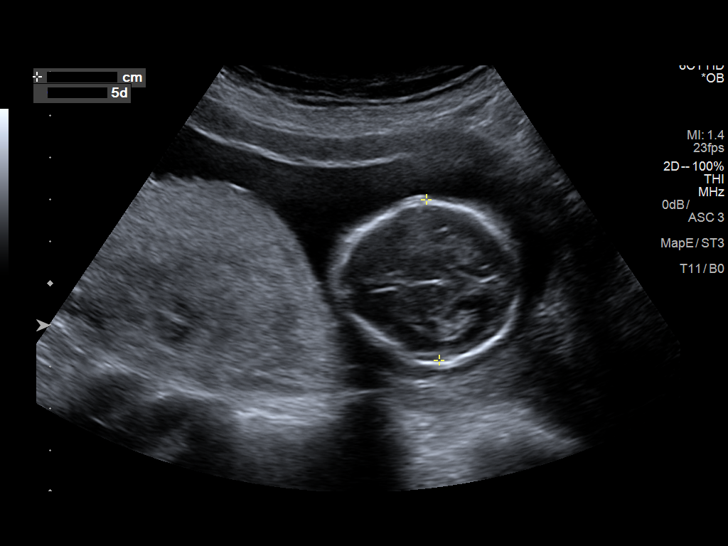
[im 14/18]
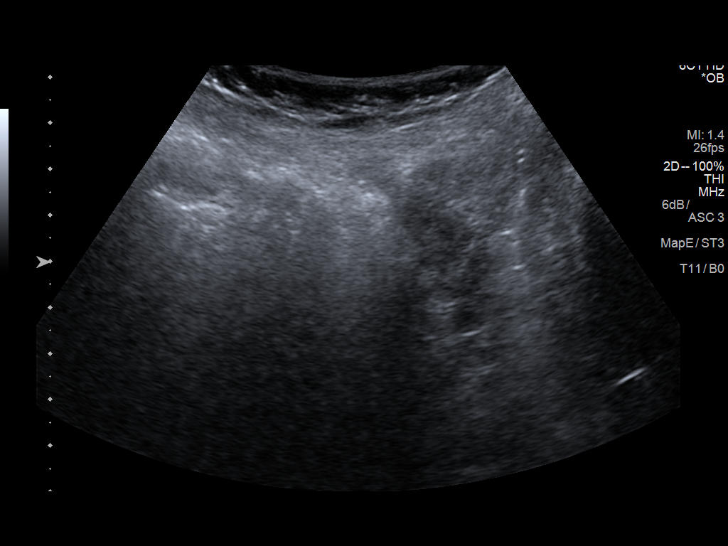
[im 15/18]
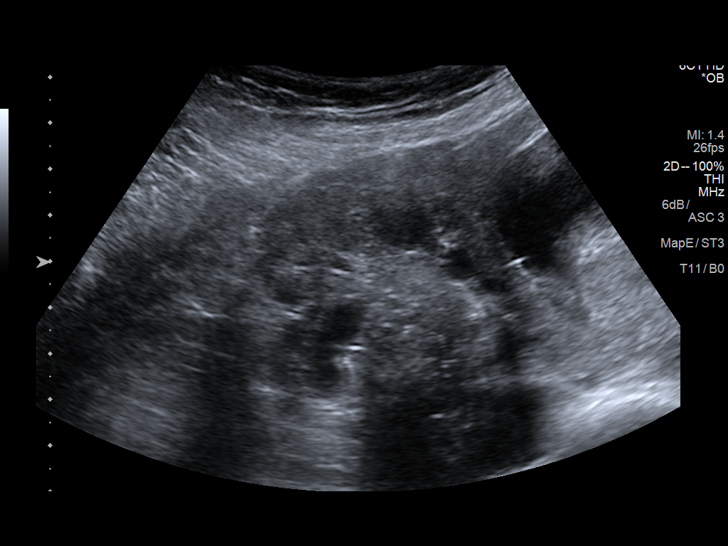
[im 17/18]
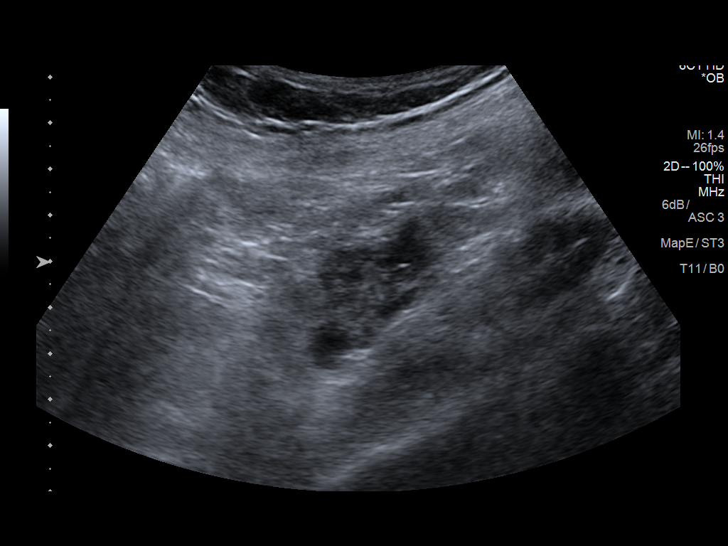
[im 18/18]
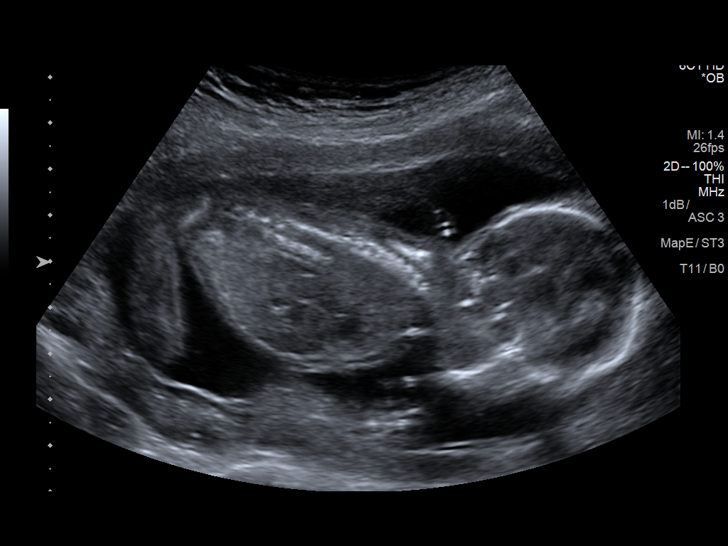

[14 of 18 positions shown; findings below may reference images not displayed]

IMPRESSION: 1. Single living intrauterine gestation with an estimated
gestational age of 17 weeks and 1 day.
2.  No complications.
FINDINGS: Number of Fetuses:  1

Heart Rate:  152 bpm

Presentation: Breech

Placental Location: Fundal, no evidence for previa.

Amniotic Fluid (Subjective):  Within normal limits.

BPD:  3.77cm 17w  4d

MATERNAL FINDINGS:

Cervix:  Appears closed.

Uterus/Adnexae:  No abnormality visualized.
IMPRESSION: 1. Single living intrauterine gestation with an estimated
gestational age of 7 weeks and 1 day.
2. No complications.

This exam is performed on an emergent basis and does not
comprehensively evaluate fetal size, dating, or anatomy; follow-up
complete OB US should be considered if further fetal assessment is
warranted.

## 2016-07-13 ENCOUNTER — Ambulatory Visit (HOSPITAL_COMMUNITY)
Admission: EM | Admit: 2016-07-13 | Discharge: 2016-07-13 | Disposition: A | Discharge status: Home / Self Care | Payer: Medicaid Other | Attending: Internal Medicine | Admitting: Internal Medicine

## 2016-07-13 ENCOUNTER — Encounter (HOSPITAL_COMMUNITY): Payer: Self-pay | Admitting: Emergency Medicine

## 2016-07-13 DIAGNOSIS — R030 Elevated blood-pressure reading, without diagnosis of hypertension: Secondary | ICD-10-CM | POA: Diagnosis not present

## 2016-07-13 HISTORY — DX: Essential (primary) hypertension: I10

## 2016-07-13 NOTE — ED Triage Notes (Signed)
Trying to get a job, informed blood pressure is high.  Denies headache or chest pain

## 2016-07-13 NOTE — Discharge Instructions (Addendum)
Blood pressure was not elevated at the urgent care today.  Physical examination was unremarkable.  May return to work without restriction.  Ria ClockLaura Velita Quirk MD

## 2016-07-13 NOTE — ED Provider Notes (Signed)
[facility]    CSN: [id number] Arrival date & time: 07/13/16  1015     History   Chief Complaint Chief Complaint  Patient presents with  . Hypertension   Swahili audio interpreter services used  HPI Karen Howard is a 36 y.o. female. She presents today after applying for work at a [employer].  She was found to have elevated blood pressure there but does not have the exact reading.  She reports that she was told by the company doctor to be evaluated because of the blood pressure reading, and to bring back a note.  She did not have chest pain, trouble breathing, leg swelling, or headache at that time, and does not have these symptoms now.      HPI  Past Medical History:  Diagnosis Date  None reported  Patient Active Problem List   Diagnosis Date Noted  . Sickle cell trait (HCC) 12/06/2015    Past Surgical History:  Procedure Laterality Date  . NO PAST SURGERIES      OB History    Gravida Para Term Preterm AB Living   9 9 9  0 0 9   SAB TAB Ectopic Multiple Live Births   0 0 0 0 9       Home Medications    Prior to Admission medications   Medication Sig Start Date End Date Taking? Authorizing Provider  ibuprofen (ADVIL,MOTRIN) 600 MG tablet Take 1 tablet (600 mg total) by mouth every 6 (six) hours. 02/12/16   Campbell Stall, MD  norethindrone (MICRONOR,CAMILA,ERRIN) 0.35 MG tablet Take 1 tablet (0.35 mg total) by mouth daily. 05/12/16   Marny Lowenstein, PA-C  Prenatal Vit-Fe Fumarate-FA (PRENATAL MULTIVITAMIN) TABS tablet Take 1 tablet by mouth daily.    Historical Provider, MD    Family History Mother:  hypertension  Social History Social History  Substance Use Topics  . Smoking status: Never Smoker  . Smokeless tobacco: Never Used  . Alcohol use No     Allergies   Pork-derived products   Review of Systems Review of Systems  All other systems reviewed and are negative.    Physical Exam Triage Vital Signs ED Triage Vitals [07/13/16  1056]  Enc Vitals Group     BP 141/88     Pulse Rate 71     Resp 18     Temp 98.1 F (36.7 C)     Temp Source Oral     SpO2 100 %     Weight      Height      Pain Score      Pain Loc    Updated Vital Signs BP 141/88 (BP Location: Left Arm) Comment (BP Location): large cuff  Pulse 71   Temp 98.1 F (36.7 C) (Oral)   Resp 18   SpO2 100%   Physical Exam  Constitutional: She is oriented to person, place, and time. No distress.  Alert, nicely groomed  HENT:  Head: Atraumatic.  Eyes:  Conjugate gaze, no eye redness/drainage  Neck: Neck supple.  Cardiovascular: Normal rate and regular rhythm.  Exam reveals no gallop.   No murmur heard. Pulmonary/Chest: No respiratory distress. She has no wheezes. She has no rales.  Lungs clear, symmetric breath sounds  Abdominal: She exhibits no distension.  Musculoskeletal: Normal range of motion.  No leg swelling  Neurological: She is alert and oriented to person, place, and time.  Skin: Skin is warm and dry.  No cyanosis  Nursing note and  vitals reviewed.    UC Treatments / Results   Procedures Procedures (including critical care time) None today  Final Clinical Impressions(s) / UC Diagnoses   Final diagnoses:  Elevated blood-pressure reading without diagnosis of hypertension   Blood pressure was not elevated at the urgent care today.  Physical examination was unremarkable.  May return to work without restriction.  [facility] to establish care and help monitor blood pressure.  Ria ClockLaura Axel Meas MD   Eustace MooreLaura W Regis Hinton, MD 07/15/16 [phone]

## 2016-12-25 ENCOUNTER — Encounter (HOSPITAL_COMMUNITY): Payer: Self-pay | Admitting: Emergency Medicine

## 2016-12-25 ENCOUNTER — Ambulatory Visit (HOSPITAL_COMMUNITY)
Admission: EM | Admit: 2016-12-25 | Discharge: 2016-12-25 | Disposition: A | Discharge status: Home / Self Care | Payer: Medicaid Other | Attending: Family Medicine | Admitting: Family Medicine

## 2016-12-25 DIAGNOSIS — M7581 Other shoulder lesions, right shoulder: Secondary | ICD-10-CM

## 2016-12-25 MED ORDER — MELOXICAM 15 MG PO TABS: 15.0000 mg | ORAL_TABLET | Freq: Every day | ORAL | 0 refills | Status: DC

## 2016-12-25 NOTE — ED Provider Notes (Signed)
  [facility]   [id number] 12/25/16 Arrival Time: 1021   SUBJECTIVE:  Karen Howard is a 36 y.o. female who presents to the urgent care  with complaint of left shoulder pain. She is left-handed, and works at a [employer] stage [employer]. 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 Pain is been ongoing for 2 months, and is worsening. She has no numbness or tingling down her hands, and pain improves over the weekend. Has no fever or chills, or other markers of systemic illness. History is otherwise negative.  ROS: As per HPI, remainder of ROS negative.   OBJECTIVE:  Vitals:   12/25/16 1109  BP: (!) 126/96  Pulse: 65  Resp: 20  Temp: 98.1 F (36.7 C)  TempSrc: Oral  SpO2: 99%     General appearance: alert; no distress HEENT: normocephalic; atraumatic; conjunctivae normal;  Lungs: clear to auscultation bilaterally Heart: regular rate and rhythm Abdomen: soft, non-tender Musculoskeletal: Tenderness over the right infraspinatus muscle, and at the insertion of the biceps tendon. Pain with shoulder extension, and abduction Skin: warm and dry Neurologic: Grossly normal Psychological:  alert and cooperative; normal mood and affect     ASSESSMENT & PLAN:  1. Tendinitis of right rotator cuff     Meds ordered this encounter  Medications  . meloxicam (MOBIC) 15 MG tablet    Sig: Take 1 tablet (15 mg total) by mouth daily.    Dispense:  30 tablet    Refill:  0    Order Specific Question:   Supervising Provider    Answer:   Elvina Sidle [[id number]]    Recommend rest, ice to the affected joint, avoid repetitive motions when possible,*meloxicam for pain. Follow-up with orthopedics as needed Reviewed expectations re: course of current medical issues. Questions answered. Outlined signs and symptoms indicating need for more acute intervention. Patient verbalized understanding. After Visit Summary given.    Procedures:      Labs Reviewed - No data to display  No results found.  Allergies  Allergen Reactions  . Pork-Derived Products Swelling    PMHx, SurgHx, SocialHx, Medications, and Allergies were reviewed in the Visit Navigator and updated as appropriate.       Dorena Bodo, NP 12/25/16 1144

## 2016-12-25 NOTE — ED Triage Notes (Signed)
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 NAD... Ambulatory

## 2016-12-25 NOTE — Discharge Instructions (Signed)
Your symptoms are consistent with an overuse injury called rotator cuff tendinitis. Recommend rest, ice applied 15 minutes at a time every 2 hours as needed. For pain and inflammation, I prescribed meloxicam, one tablet daily. You can take Tylenol with this but no ibuprofen or naproxen. If pain persists or fails to resolve, follow up with an orthopedist in 2-3 weeks.

## 2017-05-11 NOTE — L&D Delivery Note (Signed)
Patient is 37 y.o. [id number] 2657w1d admitted for IOL for GDM. S/p IOL with Pitocin. AROM at 1607.  Prenatal course also complicated by AMA, grand multiparity, GDM.  GBS Neg  Delivery Note At 4:10 PM a viable female was delivered via Vaginal, Spontaneous (Presentation: LOA).  APGAR: 9, 9; weight pending 1hr skin to skin.   Placenta status: Intact; Velamentous cord insertion.  Cord: 3V  with the following complications: Tight nuchal x1 .  Cord pH: N/A  Anesthesia: None  Episiotomy: None Lacerations: None Est. Blood Loss (mL):  50  Mom to postpartum.  Baby to Couplet care / Skin to Skin.  Caryl AdaJazma Phelps, DO 10/13/2017, 4:27 PM

## 2017-05-30 IMAGING — US US MFM OB DETAIL+14 WK
1 series · 14 of 28 positions shown · non-contrast
Comparison: none

[Series 1: us mfm ob detail+14 wk · 146 acquisitions, 14 frames shown]
[im 6/146]
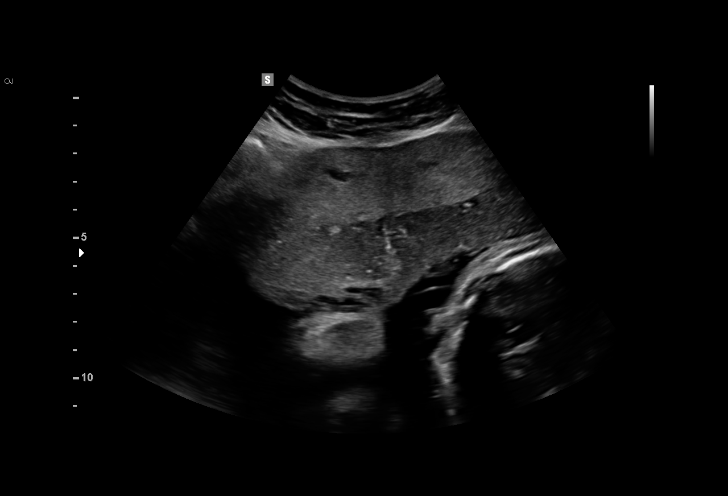
[im 17/146]
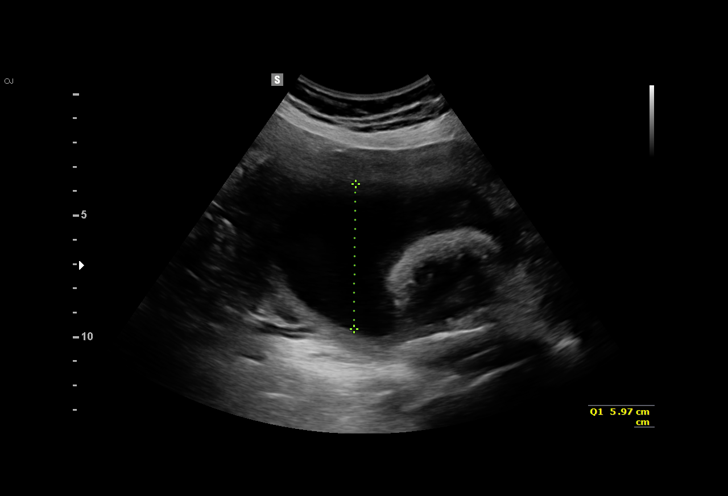
[im 27/146]
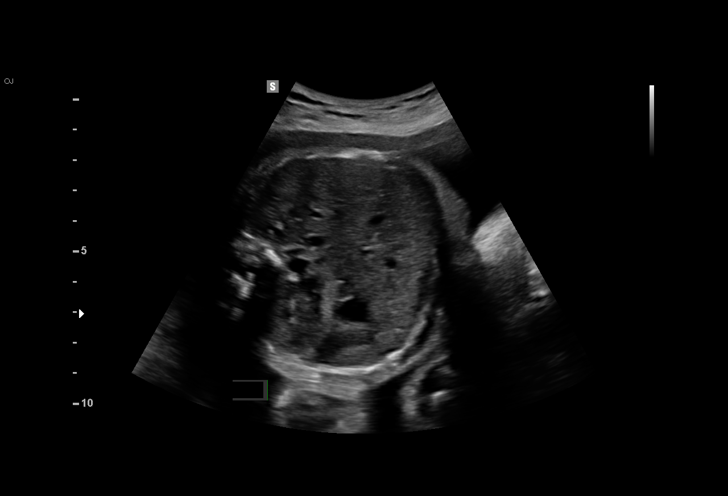
[im 38/146]
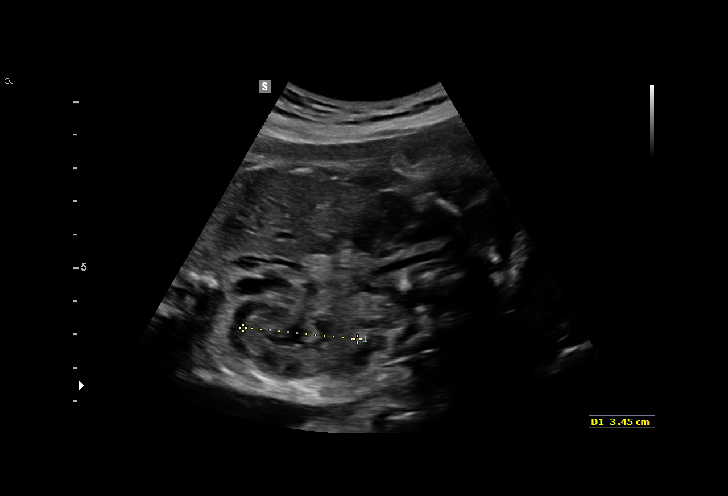
[im 49/146]
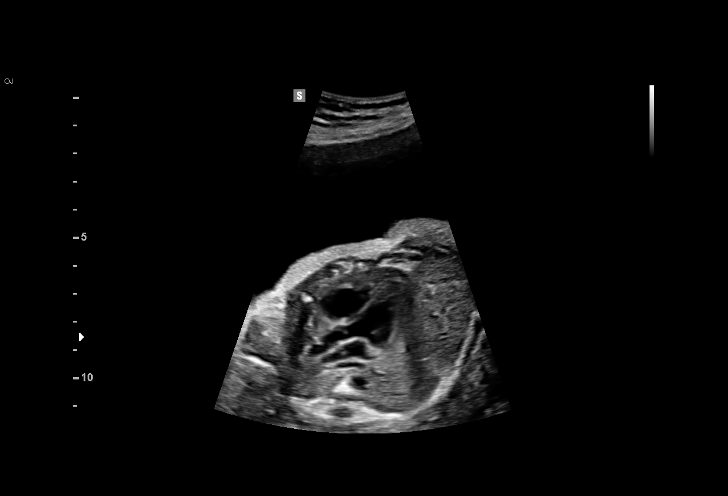
[im 60/146]
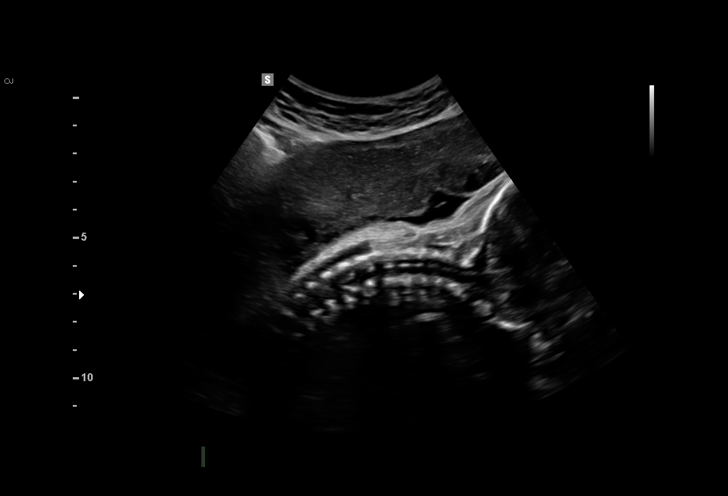
[im 70/146]
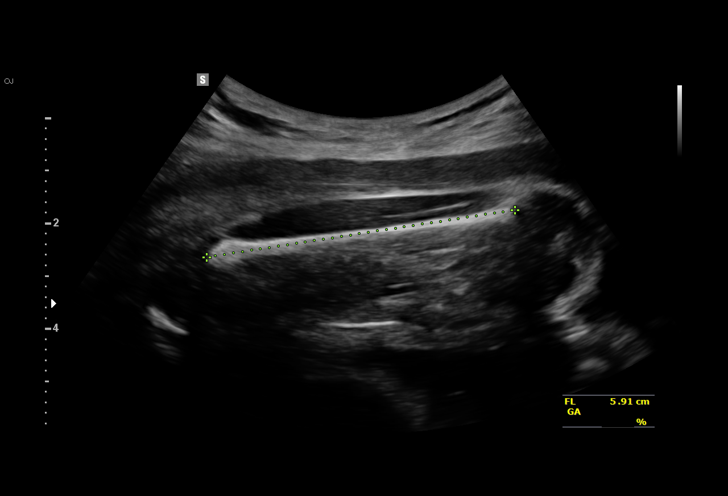
[im 81/146]
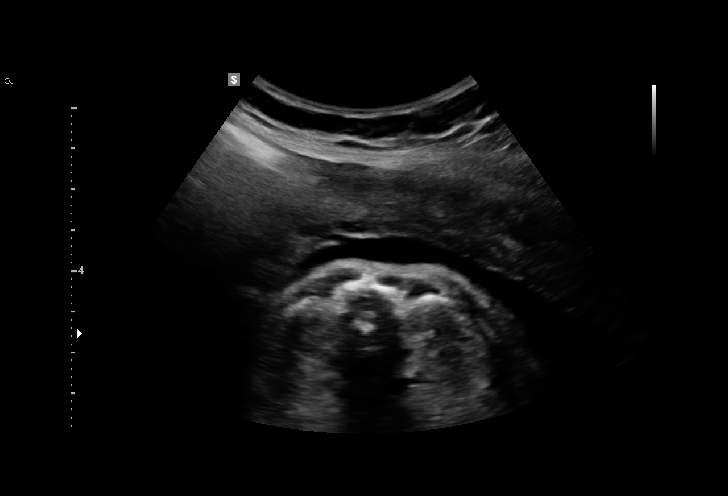
[im 92/146]
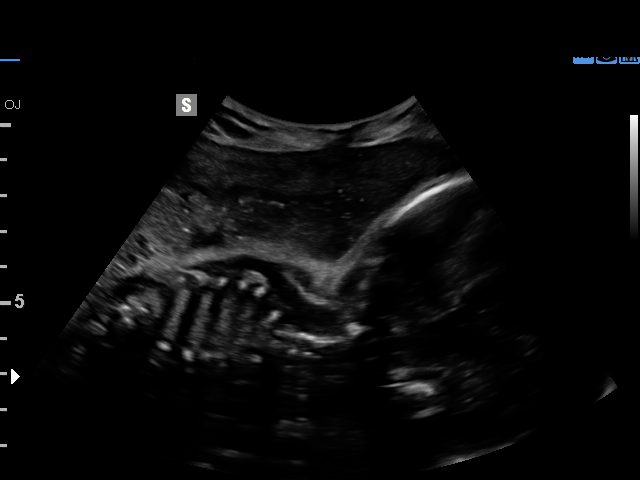
[im 103/146]
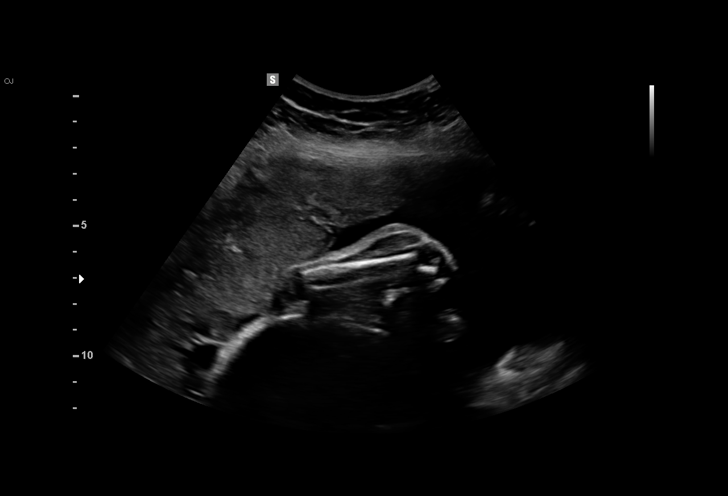
[im 113/146]
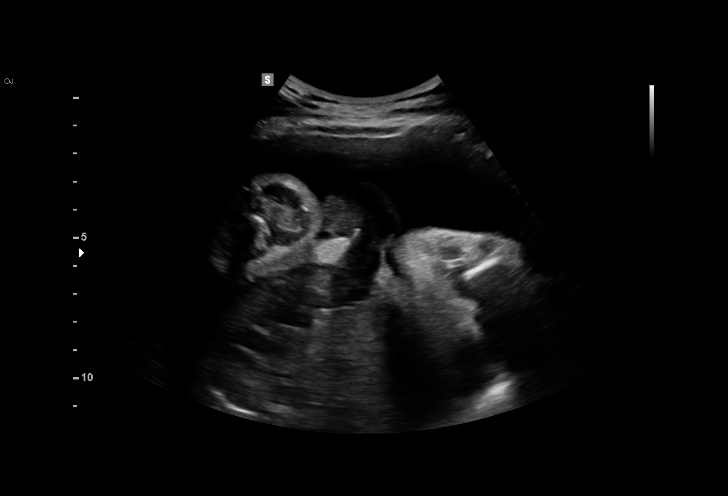
[im 124/146]
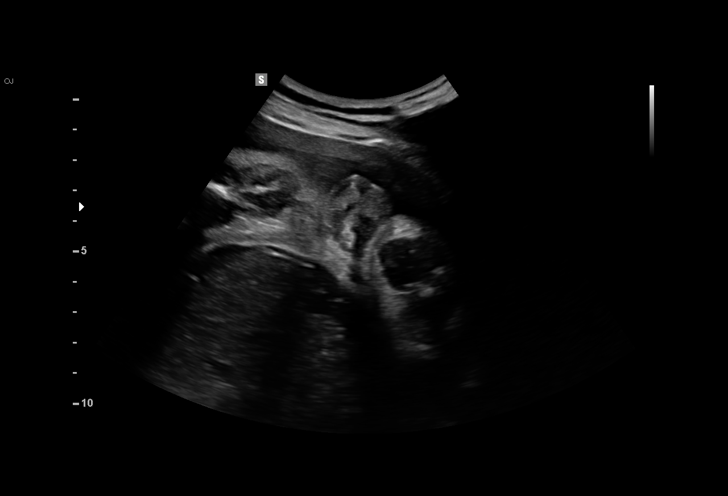
[im 135/146]
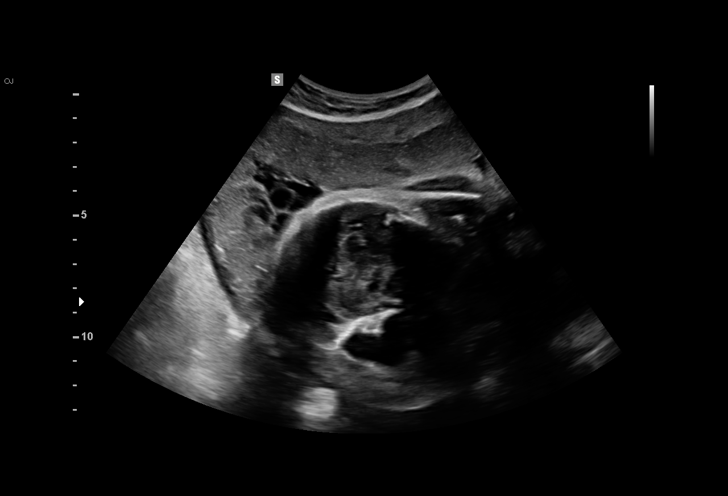
[im 146/146]
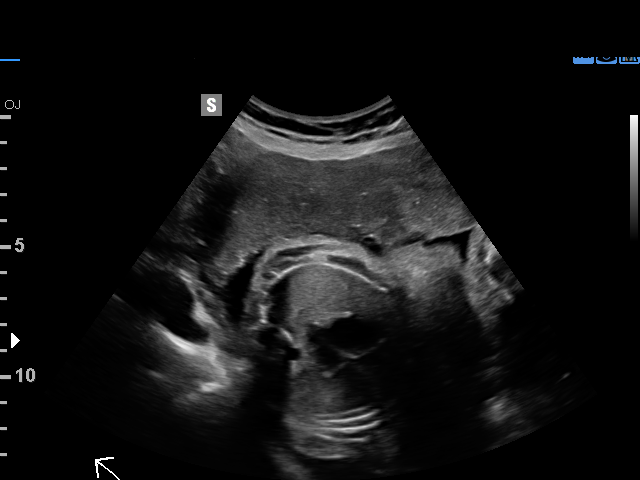

[14 of 28 positions shown; findings below may reference images not displayed]

1  MAGOSHA MAMETJA            [id number]      [id number]     [id number]
Indications

28 weeks gestation of pregnancy
Advanced maternal age multigravida 35+,
third trimester
OB History

Gravidity:    9         Term:   8
Living:       8
Fetal Evaluation

Num Of Fetuses:     1
Fetal Heart         142
Rate(bpm):
Cardiac Activity:   Observed
Presentation:       Breech
Placenta:           Anterior, above cervical os
P. Cord Insertion:  Visualized, central

Amniotic Fluid
AFI FV:      Subjectively within normal limits

AFI Sum(cm)     %Tile       Largest Pocket(cm)
19.77           78

RUQ(cm)       RLQ(cm)       LUQ(cm)        LLQ(cm)
5.97
Biometry

BPD:      77.6  mm     G. Age:  31w 1d         94  %    CI:        71.22   %   70 - 86
FL/HC:      20.1   %   19.6 -
HC:      292.9  mm     G. Age:  32w 2d         97  %    HC/AC:      1.09       0.99 -
AC:      269.8  mm     G. Age:  31w 1d         94  %    FL/BPD:     76.0   %   71 - 87
FL:         59  mm     G. Age:  30w 6d         85  %    FL/AC:      21.9   %   20 - 24
HUM:      53.4  mm     G. Age:  31w 1d         91  %

Est. FW:    8505  gm    3 lb 12 oz      83  %
Gestational Age

Clinical EDD:  28w 6d                                        EDD:   02/22/16
U/S Today:     31w 3d                                        EDD:   02/04/16
Best:          28w 6d    Det. By:   Clinical EDD             EDD:   02/22/16
Anatomy

Cranium:               Appears normal         Aortic Arch:            Not well visualized
Cavum:                 Appears normal         Ductal Arch:            Not well visualized
Ventricles:            Appears normal         Diaphragm:              Appears normal
Choroid Plexus:        Appears normal         Stomach:                Appears normal, left
sided
Cerebellum:            Appears normal         Abdomen:                Appears normal
Posterior Fossa:       Appears normal         Abdominal Wall:         Appears nml (cord
insert, abd wall)
Face:                  Appears normal         Cord Vessels:           Appears normal (3
(orbits and profile)                           vessel cord)
Lips:                  Appears normal         Kidneys:                Appear normal
Thoracic:              Appears normal         Bladder:                Appears normal
Heart:                 Appears normal         Spine:                  Appears normal
(4CH, axis, and situs
RVOT:                  Not well visualized    Upper Extremities:      Appears normal
LVOT:                  Appears normal         Lower Extremities:      Appears normal

Other:  Fetus appears to be a male. Technically difficult due to advanced GA
and fetal position.
Cervix Uterus Adnexa

Cervix
Length:            3.5  cm.
Normal appearance by transabdominal scan.

Uterus
No abnormality visualized.

Left Ovary
Not visualized.

Right Ovary
Not visualized.
Impression

Single IUP at 28w 6d (based on BPD only at 17 week limited
ultrasound)
Late prenatal care, advanced maternal age
Limited views of the fetal heart obtained due to fetal position
The remainder of the fetal anatomy appears normal
The estimated fetal weight is at the 83rd %tile
Anterior placenta without previa
Normal amniotic fluid volume
Recommendations

Recommend follow-up ultrasound examination in 4 weeks for
growth and to reevaluate the fetal heart

## 2017-05-31 ENCOUNTER — Encounter: Payer: Self-pay | Admitting: General Practice

## 2017-06-07 ENCOUNTER — Telehealth: Payer: Self-pay

## 2017-06-07 NOTE — Telephone Encounter (Signed)
Client came to the center requesting assistance calling her case worker regarding her pregnancy and applying for medicaid.Message left case worker to call me back. Nicole Cellaorothy Odean Mcelwain RN BSN PCCN CNP  [phone]

## 2017-06-16 ENCOUNTER — Encounter: Payer: Self-pay | Admitting: Nurse Practitioner

## 2017-06-27 IMAGING — US US MFM OB FOLLOW-UP
1 series · 14 of 28 positions shown · non-contrast
Comparison: none

[Series 1: us mfm ob follow-up · 14 of 33 slices shown]
[im 2/33]
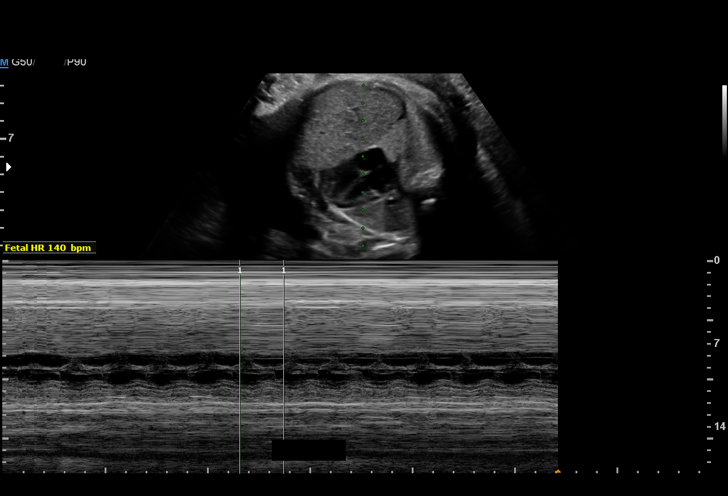
[im 4/33]
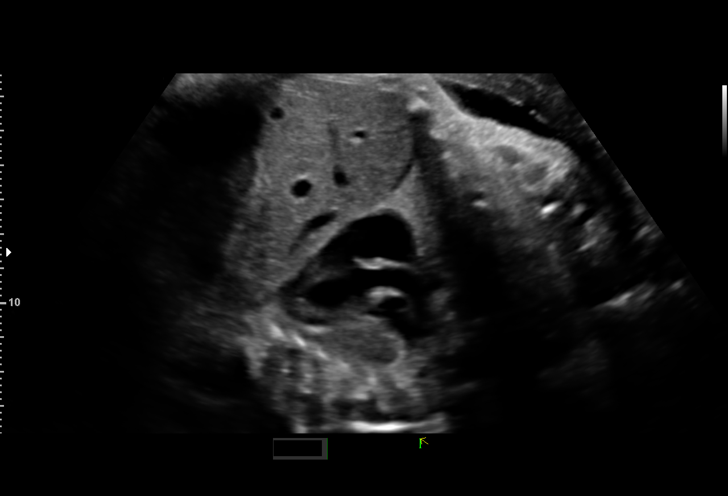
[im 6/33]
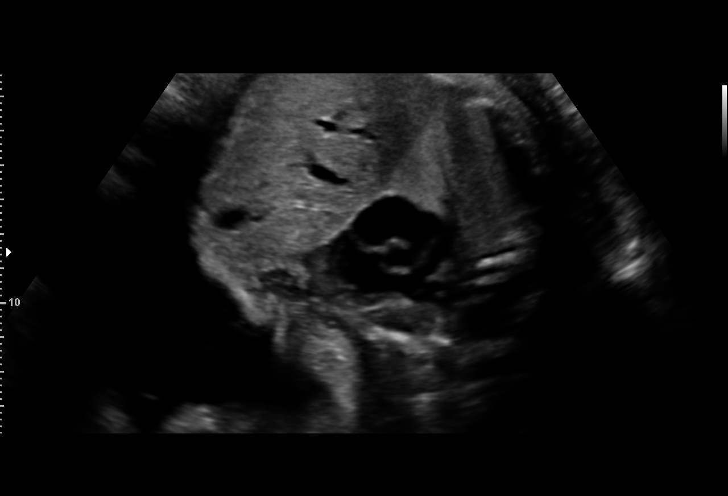
[im 9/33]
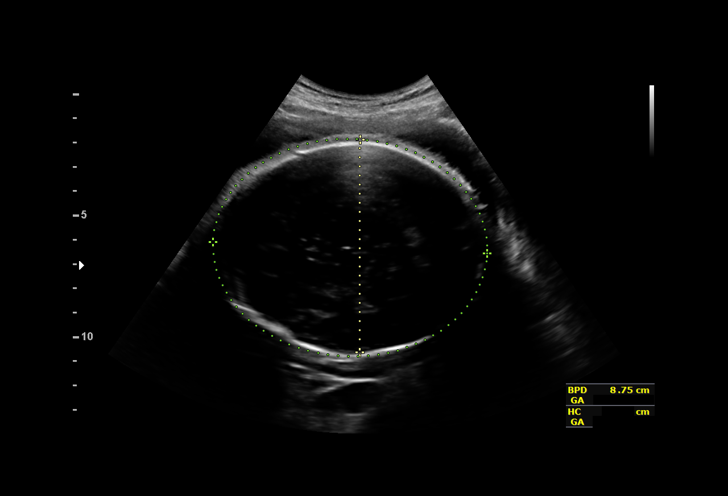
[im 11/33]
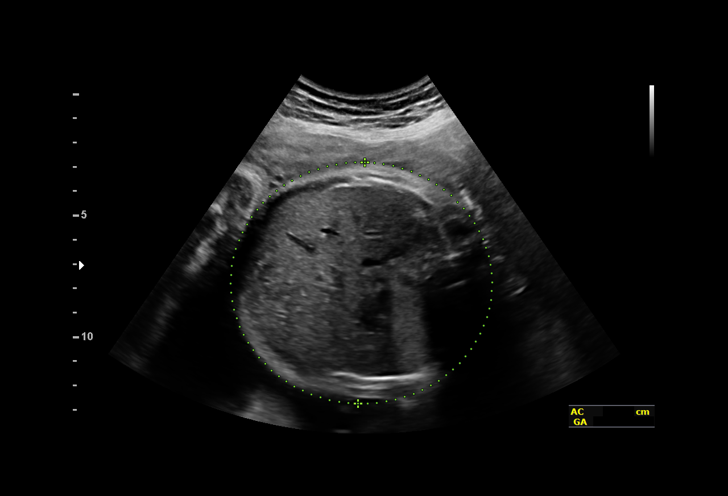
[im 14/33]
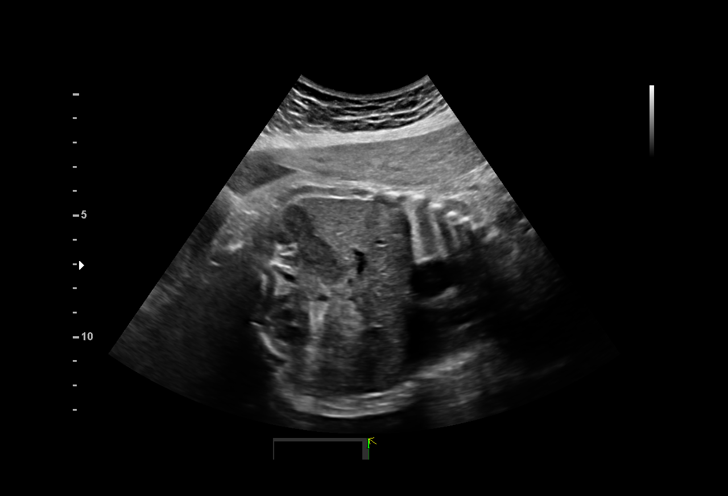
[im 16/33]
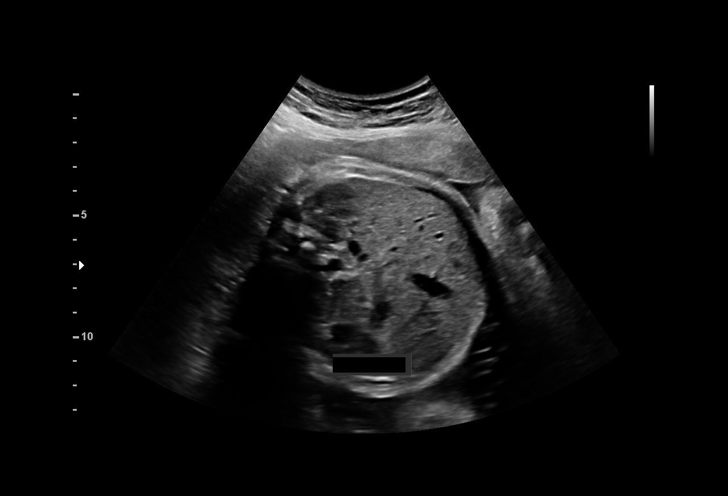
[im 18/33]
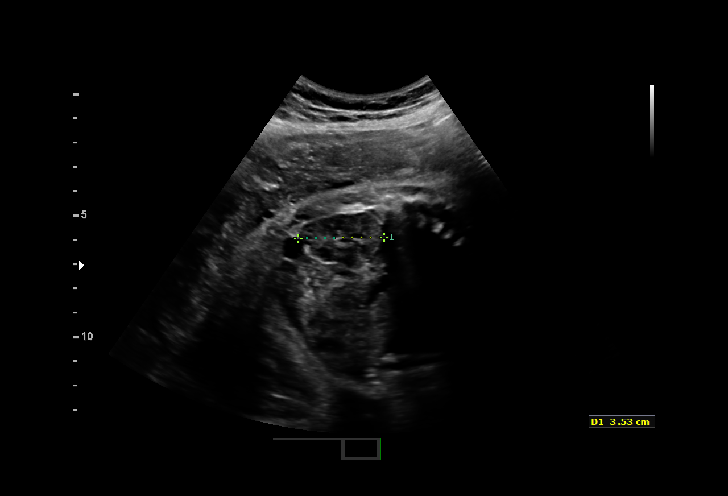
[im 21/33]
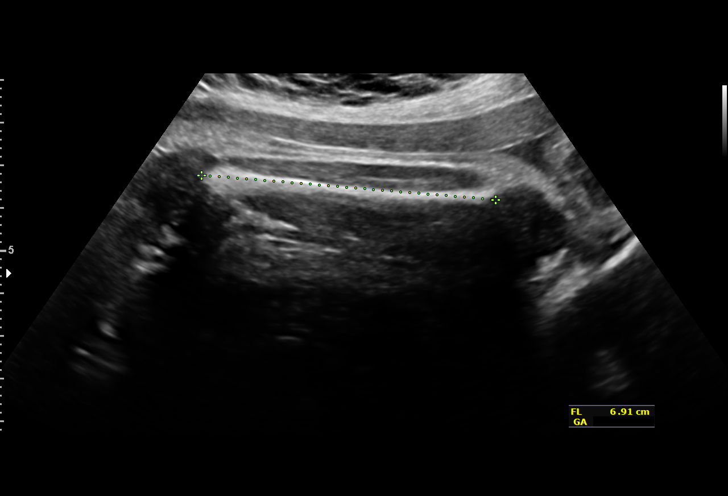
[im 23/33]
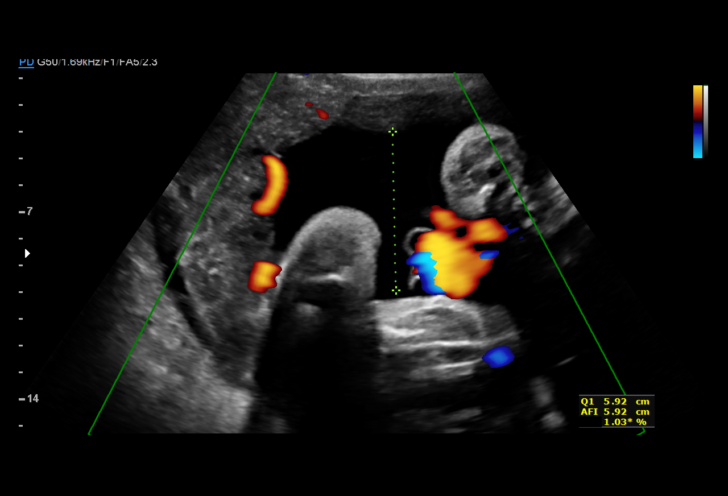
[im 25/33]
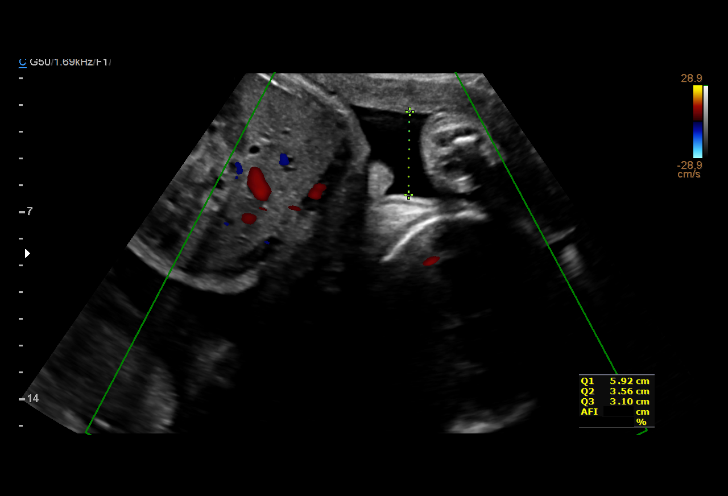
[im 28/33]
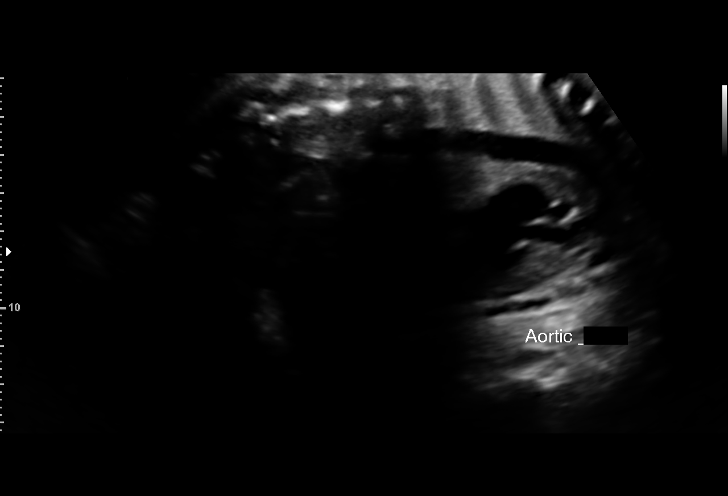
[im 30/33]
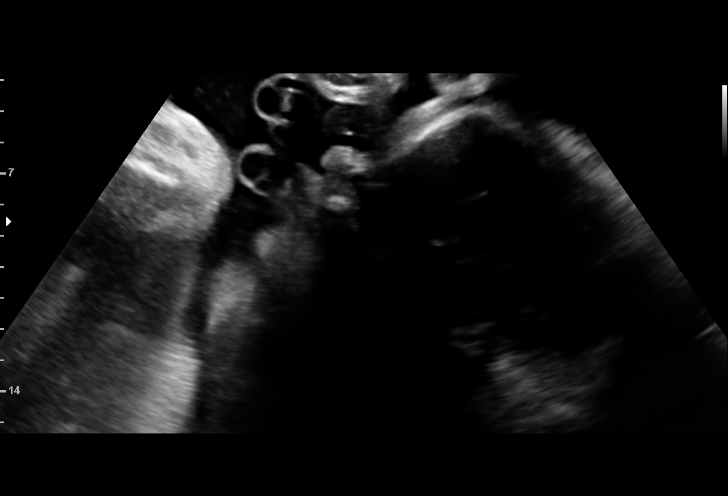
[im 33/33]
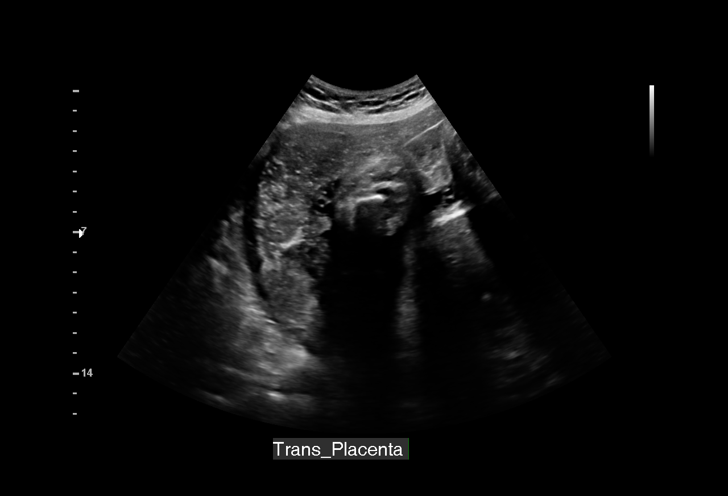

[14 of 28 positions shown; findings below may reference images not displayed]

1  KIM-ANDRE EDBERG            [id number]      [id number]     [id number]
Indications

32 weeks gestation of pregnancy
Advanced maternal age multigravida 35+,
third trimester
Follow-up incomplete fetal anatomic            Z36
evaluation
OB History

Gravidity:    9         Term:   8
Living:       8
Fetal Evaluation

Num Of Fetuses:     1
Fetal Heart         140
Rate(bpm):
Cardiac Activity:   Observed
Presentation:       Cephalic
Placenta:           Fundal, above cervical os
P. Cord Insertion:  Previously Visualized

Amniotic Fluid
AFI FV:      Subjectively within normal limits

AFI Sum(cm)     %Tile       Largest Pocket(cm)
17.08           62

RUQ(cm)       RLQ(cm)       LUQ(cm)        LLQ(cm)
5.92
Biometry
BPD:      87.8  mm     G. Age:  35w 4d         97  %    CI:        77.94   %   70 - 86
FL/HC:      22.0   %   19.9 -
HC:      314.7  mm     G. Age:  35w 2d         76  %    HC/AC:      0.98       0.96 -
AC:      321.5  mm     G. Age:  36w 0d       > 97  %    FL/BPD:     78.7   %   71 - 87
FL:       69.1  mm     G. Age:  35w 3d         93  %    FL/AC:      21.5   %   20 - 24

Est. FW:    3733  gm      6 lb 1 oz   > 90  %
Gestational Age

Clinical EDD:  32w 6d                                        EDD:   02/22/16
U/S Today:     35w 4d                                        EDD:   02/03/16
Best:          32w 6d    Det. By:   Clinical EDD             EDD:   02/22/16
Anatomy

Cranium:               Appears normal         Aortic Arch:            Appears normal
Cavum:                 Appears normal         Ductal Arch:            Not well visualized
Ventricles:            Appears normal         Diaphragm:              Appears normal
Choroid Plexus:        Previously seen        Stomach:                Appears normal, left
sided
Cerebellum:            Previously seen        Abdomen:                Appears normal
Posterior Fossa:       Previously seen        Abdominal Wall:         Previously seen
Nuchal Fold:           Previously seen        Cord Vessels:           Previously seen
Face:                  Orbits and profile     Kidneys:                Appear normal
previously seen
Lips:                  Previously seen        Bladder:                Appears normal
Thoracic:              Appears normal         Spine:                  Previously seen
Heart:                 Appears normal         Upper Extremities:      Previously seen
(4CH, axis, and situs
RVOT:                  Appears normal         Lower Extremities:      Previously seen
LVOT:                  Appears normal

Other:  Fetus appears to be a male. Technically difficult due to advanced GA
and fetal position.
Cervix Uterus Adnexa

Cervix
Not visualized (advanced GA >90wks)
Impression

Single IUP at 32w 6d
Advanced maternal age
Normal interval anatomy; limited views of the ductal arch
obtained
The estimated fetal weight today is > 90th %tile.  The AC
measures > 97th %tile.
Fundal placenta without previa
Normal amniotic fluid volume
Recommendations

Recommend follow-up ultrasound examination in 4 weeks for
growth

## 2017-07-05 DIAGNOSIS — Z3490 Encounter for supervision of normal pregnancy, unspecified, unspecified trimester: Secondary | ICD-10-CM

## 2017-07-05 LAB — GLUCOSE, POCT (MANUAL RESULT ENTRY): POC GLUCOSE: 87 mg/dL (ref 70–99)

## 2017-07-05 NOTE — Congregational Nurse Program (Signed)
Congregational Nurse Program Note  Date of Encounter: 07/05/2017  Past Medical History: Past Medical History:  Diagnosis Date  . Hypertension   . Medical history non-contributory     Encounter Details: CNP Questionnaire - 07/05/17 1355      Questionnaire   Patient Status  Refugee    Race  African    Location Patient Served At  Not Applicable    Insurance  Medicaid    Uninsured  Not Applicable    Food  No food insecurities    Housing/Utilities  Yes, have permanent housing    Transportation  No transportation needs    Interpersonal Safety  Yes, feel physically and emotionally safe where you currently live    Medication  No medication insecurities    Medical Provider  Yes    Referrals  Area Agency    ED Visit Averted  Not Applicable    Life-Saving Intervention Made  Yes     Ms Gwynneth MacleodMauwa came to the center for BP and blood sugar checks in pregnancy. Medicare has been approved and [facility] . Appointment set up for March 15th 2019.Education provided on diet in pregnancy. Nicole Cellaorothy Terrian Ridlon RN BSN PCCN CNP [phone]

## 2017-07-15 ENCOUNTER — Other Ambulatory Visit: Payer: Self-pay

## 2017-07-15 ENCOUNTER — Emergency Department (HOSPITAL_COMMUNITY)
Admission: EM | Admit: 2017-07-15 | Discharge: 2017-07-16 | Disposition: A | Discharge status: Home / Self Care | Payer: Medicaid Other | Attending: Emergency Medicine | Admitting: Emergency Medicine

## 2017-07-15 DIAGNOSIS — O9989 Other specified diseases and conditions complicating pregnancy, childbirth and the puerperium: Secondary | ICD-10-CM | POA: Diagnosis not present

## 2017-07-15 DIAGNOSIS — Z3A00 Weeks of gestation of pregnancy not specified: Secondary | ICD-10-CM | POA: Diagnosis not present

## 2017-07-15 DIAGNOSIS — Z3490 Encounter for supervision of normal pregnancy, unspecified, unspecified trimester: Secondary | ICD-10-CM

## 2017-07-15 DIAGNOSIS — R102 Pelvic and perineal pain: Secondary | ICD-10-CM | POA: Insufficient documentation

## 2017-07-15 NOTE — ED Provider Notes (Signed)
[facility] EMERGENCY DEPARTMENT Provider Note   CSN: [id number] Arrival date & time: 07/15/17  2222     History   Chief Complaint Chief Complaint  Patient presents with  . Abdominal Pain    HPI Karen Howard is a 37 y.o. female.  Patient is a 38 year old female G 10 P9009 at undetermined gestation.  She presents today for evaluation of lower abdominal discomfort.  This is been ongoing for the past 2 days.  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  She was told to see the doctor to get a work note to excuse her from this.  She denies to me she is experiencing any vaginal bleeding or leakage of fluid.  The pain that she describes is constant and not consistent with contractions she is experienced in the past.  This patient has had no prenatal care.  He denies any urinary complaints.  History is somewhat limited as the patient speaks no [place], only Swahili with history being taken through the assistance of the translator tablet.   The history is provided by the patient.  Abdominal Pain   This is a new problem. The current episode started 2 days ago. The problem occurs constantly. The problem has been gradually worsening. Associated with: pregnancy. The pain is located in the suprapubic region. The quality of the pain is cramping. The pain is moderate. Pertinent negatives include fever, hematochezia, melena, constipation and dysuria. Nothing aggravates the symptoms. Nothing relieves the symptoms.    Past Medical History:  Diagnosis Date  . Hypertension   . Medical history non-contributory     Patient Active Problem List   Diagnosis Date Noted  . Sickle cell trait (HCC) 12/06/2015    Past Surgical History:  Procedure Laterality Date  . NO PAST SURGERIES      OB History    Gravida Para Term Preterm AB Living   9 9 9  0 0 9   SAB TAB Ectopic Multiple Live  Births   0 0 0 0 9       Home Medications    Prior to Admission medications   Medication Sig Start Date End Date Taking? Authorizing Provider  meloxicam (MOBIC) 15 MG tablet Take 1 tablet (15 mg total) by mouth daily. 12/25/16   Dorena Bodo, NP  norethindrone (MICRONOR,CAMILA,ERRIN) 0.35 MG tablet Take 1 tablet (0.35 mg total) by mouth daily. 05/12/16   Marny Lowenstein, PA-C  Prenatal Vit-Fe Fumarate-FA (PRENATAL MULTIVITAMIN) TABS tablet Take 1 tablet by mouth daily.    [provider]    Family History No family history on file.  Social History Social History   Tobacco Use  . Smoking status: Never Smoker  . Smokeless tobacco: Never Used  Substance Use Topics  . Alcohol use: No  . Drug use: No     Allergies   Pork-derived products   Review of Systems Review of Systems  Constitutional: Negative for fever.  Gastrointestinal: Positive for abdominal pain. Negative for constipation, hematochezia and melena.  Genitourinary: Negative for dysuria.  All other systems reviewed and are negative.    Physical Exam Updated Vital Signs BP 126/73 (BP Location: Right Arm)   Pulse 72   Temp 97.9 F (36.6 C) (Oral)   Resp 18   SpO2 100%   Physical Exam  Constitutional: She is oriented to person, place, and time. She appears well-developed and well-nourished. No distress.  HENT:  Head: Normocephalic and atraumatic.  Neck: Normal range of motion. Neck supple.  Cardiovascular: Normal rate and regular rhythm. Exam reveals no gallop and no friction rub.  No murmur heard. Pulmonary/Chest: Effort normal and breath sounds normal. No respiratory distress. She has no wheezes.  Abdominal: Soft. Bowel sounds are normal. She exhibits no distension. There is no tenderness. There is no CVA tenderness.  Abdominal exam reveals a gravid uterus with fundal height above the umbilicus.  Musculoskeletal: Normal range of motion.  Neurological: She is alert and oriented to person,  place, and time.  Skin: Skin is warm and dry. She is not diaphoretic.  Nursing note and vitals reviewed.    ED Treatments / Results  Labs (all labs ordered are listed, but only abnormal results are displayed) Labs Reviewed - No data to display  EKG  EKG Interpretation None       Radiology No results found.  Procedures Procedures (including critical care time)  Medications Ordered in ED Medications - No data to display   Initial Impression / Assessment and Plan / ED Course  I have reviewed the triage vital signs and the nursing notes.  Pertinent labs & imaging results that were available during my care of the patient were reviewed by me and considered in my medical decision making (see chart for details).  Patient is a 37 year old female G 10 P9009 presenting this evening with abdominal pain.  She has had no prenatal care during this pregnancy and her last period was sometime late last summer.  She was taken from triage and placed into the exam room.  She was started on the fetal monitor which revealed good heart tones with no decelerations or evidence for contraction.  She was seen by rapid response OB and cleared from an OB standpoint.  Her urinalysis is clear and laboratory studies are reassuring.  At this point, I see no indication for any further workup.  She will be discharged, to return as needed for any problems.  She tells me that the main reason she came was work was concerned about her abdominal discomfort and wanted her seen.  She does not feel as though she can carry out her duties at work and would like a work note to reflect this.  Final Clinical Impressions(s) / ED Diagnoses   Final diagnoses:  None    ED Discharge Orders    None       Geoffery Lyonselo, Kwamane Whack, MD 07/16/17 [phone]

## 2017-07-15 NOTE — ED Triage Notes (Signed)
Pt to ED with c/o lower abd pain, onset earlier today.  Pt is approx 9 months preg.  LMP ("maybe June")

## 2017-07-16 LAB — URINALYSIS, ROUTINE W REFLEX MICROSCOPIC
Bilirubin Urine: NEGATIVE
Glucose, UA: 150 mg/dL — AB
Ketones, ur: NEGATIVE mg/dL
Nitrite: NEGATIVE
Protein, ur: NEGATIVE mg/dL
Specific Gravity, Urine: 1.004 — ABNORMAL LOW (ref 1.005–1.030)
pH: 7 (ref 5.0–8.0)

## 2017-07-16 LAB — CBC WITH DIFFERENTIAL/PLATELET
Basophils Absolute: 0 10*3/uL (ref 0.0–0.1)
Basophils Relative: 0 %
EOS ABS: 0.7 10*3/uL (ref 0.0–0.7)
Eosinophils Relative: 8 %
HEMATOCRIT: 30.5 % — AB (ref 36.0–46.0)
HEMOGLOBIN: 10.3 g/dL — AB (ref 12.0–15.0)
LYMPHS ABS: 2.6 10*3/uL (ref 0.7–4.0)
Lymphocytes Relative: 29 %
MCH: 28.3 pg (ref 26.0–34.0)
MCHC: 33.8 g/dL (ref 30.0–36.0)
MCV: 83.8 fL (ref 78.0–100.0)
MONO ABS: 0.5 10*3/uL (ref 0.1–1.0)
MONOS PCT: 5 %
NEUTROS ABS: 5.2 10*3/uL (ref 1.7–7.7)
NEUTROS PCT: 58 %
Platelets: 196 10*3/uL (ref 150–400)
RBC: 3.64 MIL/uL — ABNORMAL LOW (ref 3.87–5.11)
RDW: 12.9 % (ref 11.5–15.5)
WBC: 9 10*3/uL (ref 4.0–10.5)

## 2017-07-16 LAB — BASIC METABOLIC PANEL
Anion gap: 10 (ref 5–15)
BUN: <5 mg/dL — ABNORMAL LOW (ref 6–20)
CHLORIDE: 105 mmol/L (ref 101–111)
CO2: 22 mmol/L (ref 22–32)
CREATININE: 0.41 mg/dL — AB (ref 0.44–1.00)
Calcium: 8.5 mg/dL — ABNORMAL LOW (ref 8.9–10.3)
GFR calc non Af Amer: >60 mL/min (ref >60)
GLUCOSE: 107 mg/dL — AB (ref 65–99)
Potassium: 3.5 mmol/L (ref 3.5–5.1)
Sodium: 137 mmol/L (ref 135–145)

## 2017-07-16 NOTE — Congregational Nurse Program (Signed)
Congregational Nurse Program Note  Date of Encounter: 07/16/2017  Past Medical History: Past Medical History:  Diagnosis Date  . Hypertension   . Medical history non-contributory     Encounter Details: CNP Questionnaire - 07/16/17 1500      Questionnaire   Patient Status  Refugee    Race  African    Location Patient Served At  Not Applicable    Insurance  Medicaid    Uninsured  Not Applicable    Food  No food insecurities    Housing/Utilities  Yes, have permanent housing    Transportation  No transportation needs    Interpersonal Safety  Yes, feel physically and emotionally safe where you currently live    Medication  No medication insecurities    Medical Provider  Yes    Referrals  Not Applicable    ED Visit Averted  Not Applicable    Life-Saving Intervention Made  Not Applicable     Ms Karen Howard came to the center requesting assistance to read and [facility]te [facility]. Same done.Health education provided regarding diet,exercise and pre natal vitamins. Nicole Cellaorothy Muhoro RN BSN  PCCN CNP  [phone]

## 2017-07-16 NOTE — Discharge Instructions (Signed)
Return to the emergency department if you develop worsening pain, vaginal bleeding, fevers, or other new and concerning symptoms.  Make sure to keep your appointment with your obstetrician as previously scheduled.

## 2017-07-16 NOTE — Progress Notes (Addendum)
Pt is a G10P9 with a complaint of constant abdominal pain, starting 2 days ago. She thinks she is due June 2019. She has had no prenatal care, [facility] (per note from Congregational Nurse on 07/05/17)  Fetal heart tones 145bpm, multiple accelerations to 160bpm, no decelerations. No contractions seen after  20 minutes. Abdomen is soft to palpate. No bleeding or LOF.  07/16/2017 @0021 -Notified Dr Erin FullingHarraway Smith [facility], SVE, reactive NST, no UC, no bleeding or LOF. OB cleared.

## 2017-07-20 ENCOUNTER — Other Ambulatory Visit: Payer: Self-pay | Admitting: Medical

## 2017-07-20 DIAGNOSIS — Z30011 Encounter for initial prescription of contraceptive pills: Secondary | ICD-10-CM

## 2017-07-21 NOTE — Telephone Encounter (Signed)
Last refill without an appointment in the office

## 2017-08-09 DIAGNOSIS — R739 Hyperglycemia, unspecified: Secondary | ICD-10-CM

## 2017-08-09 NOTE — Congregational Nurse Program (Signed)
Congregational Nurse Program Note  Date of Encounter: 08/09/2017  Past Medical History: Past Medical History:  Diagnosis Date  . Hypertension   . Medical history non-contributory     Encounter Details: CNP Questionnaire - 08/09/17 1317      Questionnaire   Patient Status  Refugee    Race  African    Location Patient Served At  Not Applicable    Insurance  Medicaid    Uninsured  Not Applicable    Food  No food insecurities    Housing/Utilities  Yes, have permanent housing    Transportation  No transportation needs    Interpersonal Safety  Yes, feel physically and emotionally safe where you currently live    Medication  No medication insecurities    Medical Provider  Yes    Referrals  Other;Area Agency    ED Visit Averted  Not Applicable    Life-Saving Intervention Made  Not Applicable     Ms [name], came in requesting assistance making appointment to department of public health for maternity service. She missed previously scheduled appointment. A new appointment made for April 22nd at 0930hrs. Nicole CellaDorothy Muhoro RN BSN CNP PCCN  [phone]

## 2017-09-10 NOTE — Congregational Nurse Program (Signed)
Congregational Nurse Program Note  Date of Encounter: 09/10/2017  Past Medical History: Past Medical History:  Diagnosis Date  . Hypertension   . Medical history non-contributory     Encounter Details: CNP Questionnaire - 09/10/17 1434      Questionnaire   Patient Status  Refugee    Race  African    Location Patient Served At  Not Applicable    Insurance  Medicaid    Uninsured  Not Applicable    Food  No food insecurities    Housing/Utilities  Yes, have permanent housing    Transportation  No transportation needs    Interpersonal Safety  Yes, feel physically and emotionally safe where you currently live    Medication  No medication insecurities    Medical Provider  Yes    Referrals  Not Applicable    ED Visit Averted  Not Applicable    Life-Saving Intervention Made  Not Applicable     Ms Bucy came in to the center for BP check. She is pregnant and due this month of May. She has been to the health department and has an appointment with provider on May 7th. Health education provided regarding diet, rest and exercises in pregnancy. Reviewed signs of labor and verbalized understanding.She has no questions or concerns. Arman Bogus RN BSN PCCN  [phone]

## 2017-09-16 LAB — OB RESULTS CONSOLE ANTIBODY SCREEN: ANTIBODY SCREEN: NEGATIVE

## 2017-09-16 LAB — OB RESULTS CONSOLE HGB/HCT, BLOOD
HEMATOCRIT: 33
HEMOGLOBIN: 10.4

## 2017-09-16 LAB — OB RESULTS CONSOLE RPR: RPR: NONREACTIVE

## 2017-09-16 LAB — GLUCOSE, 1 HOUR
Drug Screen, Urine: NEGATIVE
Glucose 1 Hour: 167

## 2017-09-16 LAB — OB RESULTS CONSOLE HEPATITIS B SURFACE ANTIGEN: HEP B S AG: NEGATIVE

## 2017-09-16 LAB — OB RESULTS CONSOLE PLATELET COUNT: Platelets: 229

## 2017-09-16 LAB — OB RESULTS CONSOLE GC/CHLAMYDIA
Chlamydia: NEGATIVE
Gonorrhea: NEGATIVE

## 2017-09-16 LAB — OB RESULTS CONSOLE ABO/RH: RH Type: POSITIVE

## 2017-09-16 LAB — OB RESULTS CONSOLE VARICELLA ZOSTER ANTIBODY, IGG: VARICELLA IGG: IMMUNE

## 2017-09-16 LAB — OB RESULTS CONSOLE HIV ANTIBODY (ROUTINE TESTING): HIV: NONREACTIVE

## 2017-09-16 LAB — OB RESULTS CONSOLE RUBELLA ANTIBODY, IGM: RUBELLA: IMMUNE

## 2017-09-20 LAB — GLUCOSE, 3 HOUR GESTATIONAL

## 2017-09-20 LAB — OB RESULTS CONSOLE GBS: GBS: NEGATIVE

## 2017-09-23 ENCOUNTER — Encounter: Payer: Self-pay | Admitting: *Deleted

## 2017-09-28 ENCOUNTER — Other Ambulatory Visit: Payer: Self-pay

## 2017-09-28 ENCOUNTER — Encounter: Payer: Self-pay | Admitting: Obstetrics & Gynecology

## 2017-09-28 ENCOUNTER — Ambulatory Visit (INDEPENDENT_AMBULATORY_CARE_PROVIDER_SITE_OTHER): Payer: Medicaid Other | Admitting: Obstetrics & Gynecology

## 2017-09-28 DIAGNOSIS — Z3483 Encounter for supervision of other normal pregnancy, third trimester: Secondary | ICD-10-CM

## 2017-09-28 DIAGNOSIS — Z3480 Encounter for supervision of other normal pregnancy, unspecified trimester: Secondary | ICD-10-CM

## 2017-09-28 NOTE — Progress Notes (Signed)
   PRENATAL VISIT NOTE  Subjective:  Karen Howard is a 37 y.o. [phone] at [redacted]w[redacted]d being seen today for ongoing prenatal care.  She is currently monitored for the following issues for this high-risk pregnancy and has Supervision of other normal pregnancy, antepartum; Sickle cell trait (HCC); and Grand multipara on their problem list.  Patient reports no complaints.  Contractions: Irregular. Vag. Bleeding: None.  Movement: Present. Denies leaking of fluid.   The following portions of the patient's history were reviewed and updated as appropriate: allergies, current medications, past family history, past medical history, past social history, past surgical history and problem list. Problem list updated.  Objective:   Vitals:   09/28/17 0932  BP: 111/72  Pulse: 80  Weight: 203 lb 3.2 oz (92.2 kg)    Fetal Status: Fetal Heart Rate (bpm): 140   Movement: Present     General:  Alert, oriented and cooperative. Patient is in no acute distress.  Skin: Skin is warm and dry. No rash noted.   Cardiovascular: Normal heart rate noted  Respiratory: Normal respiratory effort, no problems with respiration noted  Abdomen: Soft, gravid, appropriate for gestational age.  Pain/Pressure: Present     Pelvic: Cervical exam performed        Extremities: Normal range of motion.  Edema: Trace  Mental Status: Normal mood and affect. Normal behavior. Normal judgment and thought content.   Assessment and Plan:  Pregnancy: [id number] at [redacted]w[redacted]d  1. Supervision of other normal pregnancy, antepartum   Term labor symptoms and general obstetric precautions including but not limited to vaginal bleeding, contractions, leaking of fluid and fetal movement were reviewed in detail with the patient. Please refer to After Visit Summary for other counseling recommendations.  Return in about 1 week (around 10/05/2017).  Future Appointments  Date Time Provider Department Center  09/28/2017  9:35 AM Allie Bossier, MD [facility]    10/07/2017  8:00 AM [facility]    Allie Bossier, MD

## 2017-09-29 ENCOUNTER — Telehealth: Payer: Self-pay | Admitting: *Deleted

## 2017-09-29 ENCOUNTER — Encounter: Payer: Self-pay | Admitting: Student

## 2017-09-29 NOTE — Telephone Encounter (Signed)
Given orders fromJudeth Hornence, NP to call patient with orders to come to Hospital for IOL 10/05/17 0830.Called patient with Karen Howard [phone] and informed her that her doctor wants her to come to hospital MAU 10/05/17 0830 for IOL.  She voices understanding. Also informed her if she goes into labor before then report to MAU at any time. She also reports ears stopped up , maybe fever. Advised to go to Urgent care or pcp.

## 2017-10-05 ENCOUNTER — Telehealth: Payer: Self-pay | Admitting: *Deleted

## 2017-10-05 ENCOUNTER — Telehealth: Payer: Self-pay | Admitting: Advanced Practice Midwife

## 2017-10-05 NOTE — Telephone Encounter (Signed)
Attempted to call the patient with [facility] Interpreter [phone]. No answer at this time. Patient did now show for IOL. Will attempt to call again.  Karen Howard 10:14 AM 10/05/17

## 2017-10-05 NOTE — Telephone Encounter (Addendum)
-----   Message from Armando Reichert, CNM sent at 10/05/2017 10:55 AM EDT ----- Regarding: Did not show up for IOL  Karen Howard, Karen Howard [[id number]] this patient did not show up for her induction. I attempted to call her with the interpretor, but did not get an answer. If someone from the clinic can also call her please. She either needs an appointment or a discussion about coming in for induction.   [name]  5/28 1330  I called pt w/[employer] interpreter [phone] and heard message stating that pt does not have voice mail set up - unable to leave message. Staff will continue to try to reach pt regarding missed appt today for IOL. Note - pt has scheduled office appt on 5/30 @ 0800.

## 2017-10-06 ENCOUNTER — Encounter: Payer: Self-pay | Admitting: *Deleted

## 2017-10-06 DIAGNOSIS — O99810 Abnormal glucose complicating pregnancy: Secondary | ICD-10-CM

## 2017-10-06 NOTE — Telephone Encounter (Signed)
Called patient with [facility] interpreter [phone], no answer on either listed number. Unable to leave any messages due to voicemail not being set up.

## 2017-10-07 ENCOUNTER — Encounter: Payer: BLUE CROSS/BLUE SHIELD | Attending: Obstetrics & Gynecology | Admitting: *Deleted

## 2017-10-07 ENCOUNTER — Ambulatory Visit: Payer: Medicaid Other | Admitting: *Deleted

## 2017-10-07 ENCOUNTER — Ambulatory Visit (INDEPENDENT_AMBULATORY_CARE_PROVIDER_SITE_OTHER): Payer: Medicaid Other | Admitting: Student

## 2017-10-07 ENCOUNTER — Inpatient Hospital Stay (HOSPITAL_COMMUNITY)
Admission: AD | Admit: 2017-10-07 | Payer: Medicaid Other | Source: Ambulatory Visit | Admitting: Obstetrics and Gynecology

## 2017-10-07 ENCOUNTER — Ambulatory Visit: Payer: Self-pay

## 2017-10-07 VITALS — BP 118/71 | HR 79

## 2017-10-07 DIAGNOSIS — O24419 Gestational diabetes mellitus in pregnancy, unspecified control: Secondary | ICD-10-CM

## 2017-10-07 DIAGNOSIS — Z789 Other specified health status: Secondary | ICD-10-CM | POA: Diagnosis not present

## 2017-10-07 DIAGNOSIS — Z758 Other problems related to medical facilities and other health care: Secondary | ICD-10-CM | POA: Insufficient documentation

## 2017-10-07 DIAGNOSIS — Z713 Dietary counseling and surveillance: Secondary | ICD-10-CM | POA: Diagnosis present

## 2017-10-07 DIAGNOSIS — R7302 Impaired glucose tolerance (oral): Secondary | ICD-10-CM | POA: Diagnosis not present

## 2017-10-07 DIAGNOSIS — Z3480 Encounter for supervision of other normal pregnancy, unspecified trimester: Secondary | ICD-10-CM

## 2017-10-07 LAB — POCT URINALYSIS DIP (DEVICE)
Bilirubin Urine: NEGATIVE
Glucose, UA: NEGATIVE mg/dL
Ketones, ur: NEGATIVE mg/dL
Leukocytes, UA: NEGATIVE
Nitrite: NEGATIVE
PROTEIN: NEGATIVE mg/dL
SPECIFIC GRAVITY, URINE: 1.01 (ref 1.005–1.030)
UROBILINOGEN UA: 0.2 mg/dL (ref 0.0–1.0)
pH: 6 (ref 5.0–8.0)

## 2017-10-07 LAB — GLUCOSE, CAPILLARY: Glucose-Capillary: 96 mg/dL (ref 65–99)

## 2017-10-07 MED ORDER — ACCU-CHEK GUIDE ME W/DEVICE KIT: 1.0000 | PACK | Freq: Once | 0 refills | Status: AC

## 2017-10-07 MED ORDER — GLUCOSE BLOOD VI STRP: ORAL_STRIP | 12 refills | Status: DC

## 2017-10-07 MED ORDER — ACCU-CHEK FASTCLIX LANCETS MISC: 1.0000 | Freq: Four times a day (QID) | 12 refills | Status: DC

## 2017-10-07 NOTE — Patient Instructions (Signed)
Gestational Diabetes Mellitus, Self Care When you have gestational diabetes (gestational diabetes mellitus), you must keep your blood sugar (glucose) under control. You can do this with:  Nutrition.  Exercise.  Lifestyle changes.  Medicines or insulin, if needed.  Support from your doctors and others.  How do I manage my blood sugar?  Check your blood sugar every day during pregnancy. Check it as often as told.  Call your doctor if your blood sugar is above your goal numbers for 2 tests in a row. Your doctor will set treatment goals for you. Try to have these blood sugars:  After not eating for a long time (after fasting): at or below 95 mg/dL (5.3 mmol/L).  After meals (postprandial): ? One hour after a meal: at or below 140 mg/dL (7.8 mmol/L). ? Two hours after a meal: at or below 120 mg/dL (6.7 mmol/L).  A1c (hemoglobin A1c) level: 6-6.5%.  What do I need to know about high blood sugar? High blood sugar is called hyperglycemia. Know the early signs of high blood sugar. Signs include:  Feeling: ? Thirsty. ? Hungry. ? Very tired.  Needing to pee (urinate) more than usual.  Blurry vision.  What do I need to know about low blood sugar? Low blood sugar is called hypoglycemia. This is when blood sugar is at or below 70 mg/dL (3.9 mmol/L). Symptoms may include:  Feeling: ? Hungry. ? Worried or nervous (anxious). ? Sweaty or clammy. ? Confused. ? Dizzy. ? Sleepy. ? Sick to your stomach (nauseous).  Having: ? A fast heartbeat. ? A headache. ? A change in vision. ? Jerky movements that you cannot control (seizure). ? Nightmares. ? Tingling or no feeling (numbness) around the mouth, lips, or tongue.  Having trouble with: ? Talking. ? Paying attention (concentrating). ? Moving (coordination). ? Sleeping.  Shaking.  Passing out (fainting).  Getting upset easily (irritability).  Treating low blood sugar  To treat low blood sugar, eat or drink something  sugary right away. If you can think clearly and swallow safely, follow the 15:15 rule:  Take 15 grams of a fast-acting carb (carbohydrate). Some fast-acting carbs are: ? 1 tube of glucose gel. ? 3 sugar tablets (glucose pills). ? 6-8 pieces of hard candy. ? 4 oz (120 mL) of fruit juice. ? 4 oz (120 mL) regular (not diet) soda.  Check your blood sugar 15 minutes after you take the carb.  If your blood sugar is still at or below 70 mg/dL (3.9 mmol/L), take 15 grams of a carb again.  If your blood sugar does not go above 70 mg/dL (3.9 mmol/L) after 3 tries, get help right away.  After your blood sugar goes back to normal, eat a meal or a snack within 1 hour.  Treating very low blood sugar If your blood sugar is at or below 54 mg/dL (3 mmol/L), you have very low blood sugar (severe hypoglycemia). This is an emergency. Do not wait to see if the symptoms will go away. Get medical help right away. Call your local emergency services (911 in the [place] Do not drive yourself to the hospital. If you have very low blood sugar and you cannot eat or drink, you may need a glucagon shot (injection). A family member or friend should learn:  How to check your blood sugar.  How to give you a glucagon shot.  Ask your doctor if you need a glucagon shot kit at home. What else is important to manage my diabetes? Medicine  Take your insulin and diabetes medicines as told.  Do not run out of insulin or medicines.  Adjust your insulin and diabetes medicines as told. Food   Make healthy food choices. These include: ? Chicken, fish, egg whites, and beans. ? Oats, whole wheat, bulgur, brown rice, quinoa, and millet. ? Fresh fruits and vegetables. ? Low-fat dairy products. ? Nuts, avocado, olive oil, and canola oil.  Make an eating plan. A food specialist (dietitian) can help you.  Follow instructions from your doctor about what you cannot eat or drink.  Drink enough fluid to keep your pee (urine)  clear or pale yellow.  Eat healthy snacks between healthy meals.  Keep track of carbs you eat. Read food labels. Learn about food serving sizes.  Follow your sick day plan when you cannot eat or drink normally. Make this plan with your doctor. Activity  Exercise 30 minutes or more a day or as much as told by your doctor.  Talk with your doctor if you start a new exercise. Your doctor may need to adjust your insulin, medicines, or food. Lifestyle  Do not drink alcohol.  Do not use any tobacco products. If you need help quitting, ask your doctor.  Learn how to deal with stress. If you need help with this, ask your doctor. Body care   Stay up to date with your shots (immunizations).  Brush your teeth and gums two times a day. Floss at least one time a day.  Go to the dentist least one time every 6 months.  Stay at a healthy weight while you are pregnant. General instructions  Take over-the-counter and prescription medicines only as told by your doctor.  Ask your doctor about risks of high blood pressure in pregnancy. These are called preeclampsia and eclampsia.  Share your diabetes care plan with: ? Your work or school. ? People you live with.  Check your pee for ketones: ? When you are sick. ? As told by your doctor.  Ask your doctor: ? Do I need to meet with a diabetes educator? ? Where can I find a support group for people with diabetes?  Carry a card or wear jewelry that says that you have diabetes.  Keep all follow-up visits with your doctor. This is important. Care after giving birth   Have your blood sugar checked 4-12 weeks after you give birth.  Get checked for diabetes at least every 3 years. Where to find more information: To learn more about diabetes, visit:  American Diabetes Association: www.diabetes.org/diabetes-basics/gestational  Centers for Disease Control and Prevention (CDC): http://sanchez-watson.com/.pdf  This  information is not intended to replace advice given to you by your health care provider. Make sure you discuss any questions you have with your health care provider. Document Released: 08/19/2015 Document Revised: 10/03/2015 Document Reviewed: 05/31/2015 Elsevier Interactive Patient Education  [employer].

## 2017-10-07 NOTE — Progress Notes (Signed)
  Patient was seen on 10/07/2017 for Gestational Diabetes self-management. She speaks Swahili and is here with husband and live interpretor. EDD 10/12/2017. Patient was scheduled for induction but declined. Will provide instruction on use of BG meter today.   The following learning objectives were met by the patient :    Understands to pick up meter at pharmacy today  States when to check blood glucose levels  Demonstrates proper blood glucose monitoring techniques  Plan:  Begin checking BG before breakfast and 2 hours after first bite of breakfast, lunch and dinner as directed by MD  Bring Log Book/Sheet to every medical appointment   Patient not appropriate for Baby Scripts due to language barrier  Take medication if directed by MD  Blood glucose monitor Rx called into pharmacy:  [employer] with Fast [employer] drums Patient instructed to test pre breakfast and 2 hours each meal as directed by MD  Patient instructed to monitor glucose levels: FBS: 60 - 95 mg/dl 2 hour: <120 mg/dl  Patient received the following handouts: in [place] per husband's request  Nutrition Diabetes and Pregnancy  Carbohydrate Counting List  BG Log Sheet  Patient will be seen for follow-up as needed.

## 2017-10-07 NOTE — Progress Notes (Signed)
PRENATAL VISIT NOTE  Subjective:  Karen Howard is a 37 y.o. [phone] at 81w2dbeing seen today for ongoing prenatal care.  She is currently monitored for the following issues for this high-risk pregnancy and has Supervision of other normal pregnancy, antepartum; AMA (advanced maternal age) multigravida 37+ Sickle cell trait ([place]; GRudolphmultipara; Abnormal glucose tolerance test (GTT) during pregnancy, antepartum; Gestational diabetes; and Language barrier on their problem list.   Patient was scheduled for IOL this past Tuesday but did not show up and was unable to be reached. Today she states she didn't come for induction b/c she's not in pain and doesn't believe it's time to have her baby. Discussed reason for induction is due to her gestational diabetes. Discussed complications of diabetes in pregnancy, including fetal death. Patient states she wants to wait until she starts hurting and it is time to have her baby. Swahili interpreter at beside throughout discussion.   Patient reports no complaints.  Contractions: Irregular. Vag. Bleeding: None.  Movement: Present. Denies leaking of fluid.   The following portions of the patient's history were reviewed and updated as appropriate: allergies, current medications, past family history, past medical history, past social history, past surgical history and problem list. Problem list updated.  Objective:   Vitals:   10/07/17 0841  BP: 118/71  Pulse: 79    Fetal Status: Fetal Heart Rate (bpm): NST   Movement: Present  Presentation: Vertex  General:  Alert, oriented and cooperative. Patient is in no acute distress.  Skin: Skin is warm and dry. No rash noted.   Cardiovascular: Normal heart rate noted  Respiratory: Normal respiratory effort, no problems with respiration noted  Abdomen: Soft, gravid, appropriate for gestational age.  Pain/Pressure: Present     Pelvic: Cervical exam deferred        Extremities: Normal range of motion.  Edema: None   Mental Status: Normal mood and affect. Normal behavior. Normal judgment and thought content.   Assessment and Plan:  Pregnancy: [id number] 359w2d1. Supervision of other normal pregnancy, antepartum  - Blood Glucose Monitoring Suppl (ACCU-CHEK GUIDE ME) w/Device KIT; 1 kit by Does not apply route once for 1 dose.  Dispense: 1 kit; Refill: 0 - ACCU-CHEK FASTCLIX LANCETS MISC; 1 Device by Percutaneous route 4 (four) times daily.  Dispense: 100 each; Refill: 12 - glucose blood test strip; Use as instructed  Dispense: 100 each; Refill: 12  2. Gestational diabetes mellitus (GDM) in third trimester, gestational diabetes method of control unspecified -Pt refusing IOL. Discussed risks of GDM in pregnancy - including fetal death.  -BPP wNST = 10/10 -Met with diabetes educator today. Will have patient return at 40 wks to check blood sugar log, get NST, and discuss IOL if pt agreeable at that time.   - [place] BPP W/NONSTRESS; Future - Blood Glucose Monitoring Suppl (ACCU-CHEK GUIDE ME) w/Device KIT; 1 kit by Does not apply route once for 1 dose.  Dispense: 1 kit; Refill: 0 - ACCU-CHEK FASTCLIX LANCETS MISC; 1 Device by Percutaneous route 4 (four) times daily.  Dispense: 100 each; Refill: 12 - glucose blood test strip; Use as instructed  Dispense: 100 each; Refill: 12  3. Language barrier -Swahili interpreter at bedside  Term labor symptoms and general obstetric precautions including but not limited to vaginal bleeding, contractions, leaking of fluid and fetal movement were reviewed in detail with the patient. Please refer to After Visit Summary for other counseling recommendations.  Return in about 4 days (around 10/11/2017)  for 4-6 days for [facility] with MD & NST/BPP.  Future Appointments  Date Time Provider [place]  10/12/2017  8:15 AM [facility] [place] [facility]  10/12/2017 10:05 AM Constant, Vickii Chafe, MD [facility]    Jorje Guild, NP

## 2017-10-12 ENCOUNTER — Ambulatory Visit (INDEPENDENT_AMBULATORY_CARE_PROVIDER_SITE_OTHER): Payer: Medicaid Other | Admitting: Obstetrics and Gynecology

## 2017-10-12 ENCOUNTER — Encounter: Payer: Self-pay | Admitting: Obstetrics and Gynecology

## 2017-10-12 ENCOUNTER — Ambulatory Visit: Payer: Self-pay

## 2017-10-12 ENCOUNTER — Ambulatory Visit: Payer: Medicaid Other | Admitting: *Deleted

## 2017-10-12 ENCOUNTER — Telehealth (HOSPITAL_COMMUNITY): Payer: Self-pay | Admitting: *Deleted

## 2017-10-12 VITALS — BP 128/78 | HR 71 | Wt 204.1 lb

## 2017-10-12 DIAGNOSIS — O24419 Gestational diabetes mellitus in pregnancy, unspecified control: Secondary | ICD-10-CM

## 2017-10-12 DIAGNOSIS — Z3480 Encounter for supervision of other normal pregnancy, unspecified trimester: Secondary | ICD-10-CM

## 2017-10-12 DIAGNOSIS — Z789 Other specified health status: Secondary | ICD-10-CM

## 2017-10-12 DIAGNOSIS — Z641 Problems related to multiparity: Secondary | ICD-10-CM | POA: Diagnosis not present

## 2017-10-12 DIAGNOSIS — O09523 Supervision of elderly multigravida, third trimester: Secondary | ICD-10-CM

## 2017-10-12 DIAGNOSIS — O09513 Supervision of elderly primigravida, third trimester: Secondary | ICD-10-CM

## 2017-10-12 NOTE — Progress Notes (Signed)
Live interpreter present for encounter.  Pt states she has not been checking blood sugar. Her husband went to pharmacy to pick up supplies twice and was told they did not have the Rx.

## 2017-10-12 NOTE — Telephone Encounter (Signed)
Preadmission screen Interpreter number [phone]

## 2017-10-12 NOTE — Progress Notes (Signed)

## 2017-10-12 NOTE — Progress Notes (Addendum)
   PRENATAL VISIT NOTE  Subjective:  Karen Howard is a 37 y.o. [phone] at 3959w0d being seen today for ongoing prenatal care.  She is currently monitored for the following issues for this high-risk pregnancy and has Supervision of other normal pregnancy, antepartum; AMA (advanced maternal age) multigravida 35+; Sickle cell trait (HCC); Grand multipara; Abnormal glucose tolerance test (GTT) during pregnancy, antepartum; Gestational diabetes; and Language barrier on their problem list.  Patient reports no complaints.  Contractions: Irregular. Vag. Bleeding: None.  Movement: Present. Denies leaking of fluid.   The following portions of the patient's history were reviewed and updated as appropriate: allergies, current medications, past family history, past medical history, past social history, past surgical history and problem list. Problem list updated.  Objective:   Vitals:   10/12/17 0902  BP: 128/78  Pulse: 71  Weight: 204 lb 1.6 oz (92.6 kg)    Fetal Status: Fetal Heart Rate (bpm): NST   Movement: Present     General:  Alert, oriented and cooperative. Patient is in no acute distress.  Skin: Skin is warm and dry. No rash noted.   Cardiovascular: Normal heart rate noted  Respiratory: Normal respiratory effort, no problems with respiration noted  Abdomen: Soft, gravid, appropriate for gestational age.  Pain/Pressure: Present     Pelvic: Cervical exam deferred        Extremities: Normal range of motion.  Edema: None  Mental Status: Normal mood and affect. Normal behavior. Normal judgment and thought content.   Assessment and Plan:  Pregnancy: [id number] at 2059w0d  1. Supervision of other normal pregnancy, antepartum Patient is doing well without complaints  2. Gestational diabetes mellitus (GDM) in third trimester, gestational diabetes method of control unspecified Patient was not able to start CBG testing due to inability to obtain testing supplies Discussed risks associated with  delaying induction of labor for 20 minutes. Patient agreed to induction of labor tomorrow morning NST reviewed and reactive with baseline 125, mod variability, +accels, no decels BPP 8/8  3. Language barrier Interpreter present during the encounter  4. Grand multiparity    Term labor symptoms and general obstetric precautions including but not limited to vaginal bleeding, contractions, leaking of fluid and fetal movement were reviewed in detail with the patient. Please refer to After Visit Summary for other counseling recommendations.  No follow-ups on file.  Future Appointments  Date Time Provider Department Center  10/12/2017 10:05 AM Jahzaria Vary, Gigi GinPeggy, MD [facility]    Catalina AntiguaPeggy Kurt Azimi, MD

## 2017-10-13 ENCOUNTER — Encounter (HOSPITAL_COMMUNITY): Payer: Self-pay

## 2017-10-13 ENCOUNTER — Inpatient Hospital Stay (HOSPITAL_COMMUNITY)
Admission: RE | Admit: 2017-10-13 | Discharge: 2017-10-15 | DRG: 807 | Disposition: A | Discharge status: Home / Self Care | Payer: BLUE CROSS/BLUE SHIELD | Source: Ambulatory Visit | Attending: Obstetrics and Gynecology | Admitting: Obstetrics and Gynecology

## 2017-10-13 DIAGNOSIS — O9902 Anemia complicating childbirth: Secondary | ICD-10-CM | POA: Diagnosis present

## 2017-10-13 DIAGNOSIS — O24420 Gestational diabetes mellitus in childbirth, diet controlled: Principal | ICD-10-CM | POA: Diagnosis present

## 2017-10-13 DIAGNOSIS — Z3A40 40 weeks gestation of pregnancy: Secondary | ICD-10-CM

## 2017-10-13 DIAGNOSIS — O99810 Abnormal glucose complicating pregnancy: Secondary | ICD-10-CM

## 2017-10-13 DIAGNOSIS — O09529 Supervision of elderly multigravida, unspecified trimester: Secondary | ICD-10-CM

## 2017-10-13 DIAGNOSIS — D573 Sickle-cell trait: Secondary | ICD-10-CM | POA: Diagnosis present

## 2017-10-13 DIAGNOSIS — H6120 Impacted cerumen, unspecified ear: Secondary | ICD-10-CM | POA: Diagnosis present

## 2017-10-13 DIAGNOSIS — O43123 Velamentous insertion of umbilical cord, third trimester: Secondary | ICD-10-CM | POA: Diagnosis present

## 2017-10-13 DIAGNOSIS — Z3480 Encounter for supervision of other normal pregnancy, unspecified trimester: Secondary | ICD-10-CM

## 2017-10-13 DIAGNOSIS — O24419 Gestational diabetes mellitus in pregnancy, unspecified control: Secondary | ICD-10-CM | POA: Diagnosis present

## 2017-10-13 DIAGNOSIS — Z789 Other specified health status: Secondary | ICD-10-CM | POA: Diagnosis present

## 2017-10-13 DIAGNOSIS — Z641 Problems related to multiparity: Secondary | ICD-10-CM

## 2017-10-13 DIAGNOSIS — O24410 Gestational diabetes mellitus in pregnancy, diet controlled: Secondary | ICD-10-CM | POA: Diagnosis present

## 2017-10-13 HISTORY — DX: Gestational diabetes mellitus in pregnancy, unspecified control: O24.419

## 2017-10-13 LAB — CBC
HCT: 33 % — ABNORMAL LOW (ref 36.0–46.0)
HEMOGLOBIN: 11.1 g/dL — AB (ref 12.0–15.0)
MCH: 24.6 pg — AB (ref 26.0–34.0)
MCHC: 33.6 g/dL (ref 30.0–36.0)
MCV: 73.2 fL — ABNORMAL LOW (ref 78.0–100.0)
Platelets: 271 10*3/uL (ref 150–400)
RBC: 4.51 MIL/uL (ref 3.87–5.11)
RDW: 15.7 % — AB (ref 11.5–15.5)
WBC: 10.8 10*3/uL — ABNORMAL HIGH (ref 4.0–10.5)

## 2017-10-13 LAB — TYPE AND SCREEN
ABO/RH(D): A POS
ANTIBODY SCREEN: NEGATIVE

## 2017-10-13 LAB — GLUCOSE, CAPILLARY
GLUCOSE-CAPILLARY: 96 mg/dL (ref 65–99)
GLUCOSE-CAPILLARY: 62 mg/dL — AB (ref 65–99)

## 2017-10-13 MED ORDER — DIPHENHYDRAMINE HCL 25 MG PO CAPS: 25.0000 mg | ORAL_CAPSULE | Freq: Four times a day (QID) | ORAL | Status: DC | PRN

## 2017-10-13 MED ORDER — LACTATED RINGERS IV SOLN: 500.0000 mL | INTRAVENOUS | Status: DC | PRN

## 2017-10-13 MED ORDER — ACETAMINOPHEN 325 MG PO TABS: 650.0000 mg | ORAL_TABLET | ORAL | Status: DC | PRN

## 2017-10-13 MED ORDER — ZOLPIDEM TARTRATE 5 MG PO TABS: 5.0000 mg | ORAL_TABLET | Freq: Every evening | ORAL | Status: DC | PRN

## 2017-10-13 MED ORDER — SOD CITRATE-CITRIC ACID 500-334 MG/5ML PO SOLN: 30.0000 mL | ORAL | Status: DC | PRN

## 2017-10-13 MED ORDER — ONDANSETRON HCL 4 MG/2ML IJ SOLN: 4.0000 mg | Freq: Four times a day (QID) | INTRAMUSCULAR | Status: DC | PRN

## 2017-10-13 MED ORDER — WITCH HAZEL-GLYCERIN EX PADS: 1.0000 "application " | MEDICATED_PAD | CUTANEOUS | Status: DC | PRN

## 2017-10-13 MED ORDER — ACETAMINOPHEN 325 MG PO TABS
650.0000 mg | ORAL_TABLET | ORAL | Status: DC | PRN
  Administered 2017-10-14: 650 mg via ORAL
  Filled 2017-10-13: qty 2

## 2017-10-13 MED ORDER — FENTANYL CITRATE (PF) 100 MCG/2ML IJ SOLN
100.0000 ug | INTRAMUSCULAR | Status: DC | PRN
  Administered 2017-10-13: 100 ug via INTRAVENOUS
  Filled 2017-10-13: qty 2

## 2017-10-13 MED ORDER — FLEET ENEMA 7-19 GM/118ML RE ENEM: 1.0000 | ENEMA | RECTAL | Status: DC | PRN

## 2017-10-13 MED ORDER — BENZOCAINE-MENTHOL 20-0.5 % EX AERO: 1.0000 "application " | INHALATION_SPRAY | CUTANEOUS | Status: DC | PRN

## 2017-10-13 MED ORDER — OXYCODONE-ACETAMINOPHEN 5-325 MG PO TABS: 2.0000 | ORAL_TABLET | ORAL | Status: DC | PRN

## 2017-10-13 MED ORDER — TETANUS-DIPHTH-ACELL PERTUSSIS 5-2.5-18.5 LF-MCG/0.5 IM SUSP: 0.5000 mL | Freq: Once | INTRAMUSCULAR | Status: DC

## 2017-10-13 MED ORDER — ONDANSETRON HCL 4 MG PO TABS: 4.0000 mg | ORAL_TABLET | ORAL | Status: DC | PRN

## 2017-10-13 MED ORDER — DIBUCAINE 1 % RE OINT: 1.0000 "application " | TOPICAL_OINTMENT | RECTAL | Status: DC | PRN

## 2017-10-13 MED ORDER — TERBUTALINE SULFATE 1 MG/ML IJ SOLN
0.2500 mg | Freq: Once | INTRAMUSCULAR | Status: DC | PRN
Start: 2017-10-13 — End: 2017-10-13
  Filled 2017-10-13: qty 1

## 2017-10-13 MED ORDER — OXYTOCIN 40 UNITS IN LACTATED RINGERS INFUSION - SIMPLE MED
1.0000 m[IU]/min | INTRAVENOUS | Status: DC
  Administered 2017-10-13: 2 m[IU]/min via INTRAVENOUS

## 2017-10-13 MED ORDER — SIMETHICONE 80 MG PO CHEW
80.0000 mg | CHEWABLE_TABLET | ORAL | Status: DC | PRN
Start: 2017-10-13 — End: 2017-10-15

## 2017-10-13 MED ORDER — LIDOCAINE HCL (PF) 1 % IJ SOLN
30.0000 mL | INTRAMUSCULAR | Status: DC | PRN
  Filled 2017-10-13: qty 30

## 2017-10-13 MED ORDER — PRENATAL MULTIVITAMIN CH
1.0000 | ORAL_TABLET | Freq: Every day | ORAL | Status: DC
  Administered 2017-10-14 – 2017-10-15 (×2): 1 via ORAL
  Filled 2017-10-13 (×2): qty 1

## 2017-10-13 MED ORDER — OXYTOCIN 40 UNITS IN LACTATED RINGERS INFUSION - SIMPLE MED
2.5000 [IU]/h | INTRAVENOUS | Status: DC
  Filled 2017-10-13: qty 1000

## 2017-10-13 MED ORDER — OXYTOCIN BOLUS FROM INFUSION
500.0000 mL | Freq: Once | INTRAVENOUS | Status: AC
  Administered 2017-10-13: 500 mL via INTRAVENOUS

## 2017-10-13 MED ORDER — IBUPROFEN 600 MG PO TABS
600.0000 mg | ORAL_TABLET | Freq: Four times a day (QID) | ORAL | Status: DC
  Administered 2017-10-13 – 2017-10-15 (×8): 600 mg via ORAL
  Filled 2017-10-13 (×8): qty 1

## 2017-10-13 MED ORDER — COCONUT OIL OIL: 1.0000 "application " | TOPICAL_OIL | Status: DC | PRN

## 2017-10-13 MED ORDER — OXYCODONE-ACETAMINOPHEN 5-325 MG PO TABS: 1.0000 | ORAL_TABLET | ORAL | Status: DC | PRN

## 2017-10-13 MED ORDER — ONDANSETRON HCL 4 MG/2ML IJ SOLN: 4.0000 mg | INTRAMUSCULAR | Status: DC | PRN

## 2017-10-13 MED ORDER — SENNOSIDES-DOCUSATE SODIUM 8.6-50 MG PO TABS
2.0000 | ORAL_TABLET | ORAL | Status: DC
  Administered 2017-10-13 – 2017-10-15 (×2): 2 via ORAL
  Filled 2017-10-13 (×2): qty 2

## 2017-10-13 MED ORDER — LACTATED RINGERS IV SOLN
INTRAVENOUS | Status: DC
  Administered 2017-10-13 (×2): via INTRAVENOUS

## 2017-10-13 NOTE — H&P (Signed)
Obstetric History and Physical  Karen Howard is a 37 y.o. [phone] with IUP at [redacted]w[redacted]d presenting for IOL for GDM. Patient was diet controlled without medication. Patient states she has been having  irregular contractions, none vaginal bleeding, intact membranes, with active fetal movement.  No blurry vision, headaches or peripheral edema, and RUQ pain.   Prenatal Course Source of Care: HD>WH  Dating: By late 71wk [place] --->  Estimated Date of Delivery: 10/12/17 Pregnancy complications or risks: Patient Active Problem List   Diagnosis Date Noted  . GDM, class A1 10/13/2017  . Gestational diabetes 10/07/2017  . Language barrier 10/07/2017  . Abnormal glucose tolerance test (GTT) during pregnancy, antepartum 10/06/2017  . Grand multipara 12/25/2015  . Sickle cell trait (HCC) 12/06/2015  . Supervision of other normal pregnancy, antepartum 11/27/2015  . AMA (advanced maternal age) multigravida 35+ 11/27/2015   She plans to breastfeed She desires Depo-Provera for postpartum contraception.   Sono:    @[redacted]w[redacted]d , CWD, limited normal anatomy, cephalic presentation, fundal placenta, 2964g, 60% EFW,   Prenatal labs and studies: ABO, Rh: A/Positive/-- (05/09 0000) Antibody: Negative (05/09 0000) Rubella: Immune (05/09 0000) RPR: Nonreactive (05/09 0000)  HBsAg: Negative (05/09 0000)  HIV: Non-reactive (05/09 0000)  VWU:[id number] (05/13 0000) 1 hr Glucola  167, 3hr GTT abnormal Genetic screening too late Anatomy US normal but limited due to late 3rd trimester scanning  Prenatal Transfer Tool  Maternal Diabetes: Yes:  Diabetes Type:  Diet controlled Genetic Screening: Declined Maternal Ultrasounds/Referrals: Normal but limited views Fetal Ultrasounds or other Referrals:  None Maternal Substance Abuse:  No Significant Maternal Medications:  None Significant Maternal Lab Results: None  Past Medical History:  Diagnosis Date  . Gestational diabetes   . Hypertension     Past Surgical History:   Procedure Laterality Date  . NO PAST SURGERIES      OB History  Gravida Para Term Preterm AB Living  10 9 9  0 0 9  SAB TAB Ectopic Multiple Live Births  0 0 0 0 9    # Outcome Date GA Lbr Len/2nd Weight Sex Delivery Anes PTL Lv  10 Current           9 Term 02/10/16 103w2d 05:35 / 00:19 8 lb 3.8 oz (3.735 kg) M Vag-Spont Local  LIV     Birth Comments: wnl  8 Term 09/19/13 [redacted]w[redacted]d   F Vag-Spont  N LIV  7 Term 11/26/11 [redacted]w[redacted]d   M Vag-Spont   LIV  6 Term 06/25/08 [redacted]w[redacted]d   M Vag-Spont   LIV  5 Term 07/08/05 [redacted]w[redacted]d   F Vag-Spont   LIV  4 Term 06/23/02 [redacted]w[redacted]d   F Vag-Spont   LIV  3 Term 01/20/00 [redacted]w[redacted]d   M Vag-Spont  Y LIV  2 Term 07/06/97 [redacted]w[redacted]d   M Vag-Spont  N LIV  1 Term 06/22/94 [redacted]w[redacted]d   F Vag-Spont   LIV    Social History   Socioeconomic History  . Marital status: Married    Spouse name: Not on file  . Number of children: Not on file  . Years of education: Not on file  . Highest education level: Not on file  Occupational History  . Not on file  Social Needs  . Financial resource strain: Not on file  . Food insecurity:    Worry: Not on file    Inability: Not on file  . Transportation needs:    Medical: Not on file    Non-medical: Not on file  Tobacco  Use  . Smoking status: Never Smoker  . Smokeless tobacco: Never Used  Substance and Sexual Activity  . Alcohol use: No  . Drug use: No  . Sexual activity: Yes  Lifestyle  . Physical activity:    Days per week: Not on file    Minutes per session: Not on file  . Stress: Not on file  Relationships  . Social connections:    Talks on phone: Not on file    Gets together: Not on file    Attends religious service: Not on file    Active member of club or organization: Not on file    Attends meetings of clubs or organizations: Not on file    Relationship status: Not on file  Other Topics Concern  . Not on file  Social History Narrative  . Not on file    History reviewed. No pertinent family history.  Medications Prior to  Admission  Medication Sig Dispense Refill Last Dose  . ACCU-CHEK FASTCLIX LANCETS MISC 1 Device by Percutaneous route 4 (four) times daily. (Patient not taking: Reported on 10/12/2017) 100 each 12 Not Taking  . glucose blood test strip Use as instructed (Patient not taking: Reported on 10/12/2017) 100 each 12 Not Taking  . Prenat-Fe Poly-Methfol-FA-DHA (VITAFOL ULTRA) 29-0.6-0.4-200 MG CAPS Take 1 capsule by mouth daily.  11     Allergies  Allergen Reactions  . Pork-Derived Products Swelling    Review of Systems: Negative except for what is mentioned in HPI.  Physical Exam: BP 131/87   Pulse 91   Temp 98.5 F (36.9 C) (Oral)   Resp 20   Wt 204 lb (92.5 kg)   LMP 12/18/2016 (LMP Unknown)   BMI 31.95 kg/m  CONSTITUTIONAL: Well-developed, well-nourished female in no acute distress.  HENT:  Normocephalic, atraumatic, External right and left ear normal. Oropharynx is clear and moist EYES: Conjunctivae and EOM are normal. Pupils are equal, round, and reactive to light. No scleral icterus.  NECK: Normal range of motion, supple, no masses SKIN: Skin is warm and dry. No rash noted. Not diaphoretic. No erythema. No pallor. NEUROLOGIC: Alert and oriented to person, place, and time. Normal reflexes, muscle tone coordination. No cranial nerve deficit noted. PSYCHIATRIC: Normal mood and affect. Normal behavior. Normal judgment and thought content. CARDIOVASCULAR: Normal heart rate noted, regular rhythm RESPIRATORY: Effort and breath sounds normal, no problems with respiration noted ABDOMEN: Soft, nontender, nondistended, gravid. MUSCULOSKELETAL: Normal range of motion. No edema and no tenderness. 2+ distal pulses.  Cervical Exam: Dilation: 2 Effacement (%): 50 Cervical Position: Posterior Station: Ballotable Presentation: Vertex Exam by:: Dr. Doroteo GlassmanPhelps  FHT:  Baseline rate 135 bpm   Variability moderate  Accelerations present   Decelerations none Contractions: Occasional    Pertinent  Labs/Studies:   Results for orders placed or performed during the hospital encounter of 10/13/17 (from the past 24 hour(s))  Glucose, capillary     Status: None   Collection Time: 10/13/17  8:48 AM  Result Value Ref Range   Glucose-Capillary 96 65 - 99 mg/dL    Assessment : Karen Howard is a 37 y.o. [id number] at 5770w1d being admitted for induction of labor due to A1GDM.   Plan: Labor: Will initiate induction with Pitocin.  GDM: diet-controlled. CBG on admission 96. Will monitor CBGs q4hrs. Analgesia as needed. FWB: Reassuring fetal heart tracing.   GBS negative Delivery plan: Hopeful for vaginal delivery  Video Swahili interpretor used for entire encounter   Caryl AdaJazma Maddon Horton, DO OB Fellow Faculty  Practice, [facility] 10/13/2017, 9:11 AM

## 2017-10-13 NOTE — Progress Notes (Signed)
Nurse used translator,  Misty StanleyLisa  ID [phone], to discuss POC for today, infant's blood sugar and need to repeat one at 820pm, able to go to restroom by self unless dizzy &/or lightheaded.  Pt v/u.  Pt informed nurse via translator that she cannot write.  Nurse instructed pt to put an X on the paper each time the baby fed.  Pt informed of need to keep saline lock in place until later.  Pt v/u.

## 2017-10-13 NOTE — Anesthesia Pain Management Evaluation Note (Signed)
  CRNA Pain Management Visit Note  Patient: Karen Howard, 37 y.o., female  "Hello [facility] am [facility]. We have an anesthesia team available at all times to provide care throughout the hospital, including epidural management and anesthesia for C-section. I don't know your plan for the delivery whether it a natural birth, water birth, IV sedation, nitrous supplementation, doula or epidural, but we want to meet your pain goals."   1.Was your pain managed to your expectations on prior hospitalizations?   No prior hospitalizations  2.What is your expectation for pain management during this hospitalization?     Labor support without medications  3.How can we help you reach that goal?   Record the patient's initial score and the patient's pain goal.   Pain: 0  Pain Goal: 10 [facility] wants you to be able to say your pain was always managed very well.  Karen Howard,Karen Howard 10/13/2017

## 2017-10-13 NOTE — Progress Notes (Signed)
Labor Progress Note  Zandra AbtsSara Wyman is a 37 y.o. [phone] at 9147w1d admitted for induction of labor due to Gestational diabetes.  S: Uncomfortable with contractions.   O:  BP 116/63   Pulse 71   Temp 98.3 F (36.8 C) (Oral)   Resp 18   Ht 5\' 5"  (1.651 m)   Wt 204 lb (92.5 kg)   LMP 12/18/2016 (LMP Unknown)   BMI 33.95 kg/m   No intake/output data recorded.  FHT:  FHR: 135 bpm, variability: moderate,  accelerations:  Present,  decelerations:  Absent UC:   Occasional  SVE:   Dilation: 2 Effacement (%): 50 Station: Ballotable Exam by:: Belenda CruiseHeather Mitchell RNC   Pitocin @ 10 mu/min  Labs: Lab Results  Component Value Date   WBC 10.8 (H) 10/13/2017   HGB 11.1 (L) 10/13/2017   HCT 33.0 (L) 10/13/2017   MCV 73.2 (L) 10/13/2017   PLT 271 10/13/2017    Assessment / Plan: 37 y.o. [id number] 7247w1d. Not in labor. Induction of labor due to gestational diabetes,  progressing well on pitocin  Labor: Progressing on Pitocin, will continue to increase then AROM  GDM: CBGs controlled  Fetal Wellbeing:  Category I Pain Control:  Labor support without medications Anticipated MOD:  NSVD  Expectant management   Caryl AdaJazma Gizell Danser, DO OB Fellow Center for [facility], [facility]

## 2017-10-13 NOTE — Progress Notes (Signed)
Admission papers done via live interpreter. Mom educated on call bell meals, feeding sheet. Patient informed to let RN know if she does not understand or has any needs. Mom told not to get up without RN . Parents can not read english and need interpreter for everything. We have to order meals.

## 2017-10-14 LAB — RPR: RPR: NONREACTIVE

## 2017-10-14 LAB — GLUCOSE, CAPILLARY: Glucose-Capillary: 93 mg/dL (ref 65–99)

## 2017-10-14 NOTE — Progress Notes (Signed)
Spoke with patient via language line, interpretor [phone].  Did patient's assessment and [place] sheet completed.  Mom states no other questions at this time.  Patient instructed on how to call me if needed.

## 2017-10-14 NOTE — Lactation Note (Signed)
This note was copied from a baby's chart. Lactation Consultation Note  Patient Name: Karen Zandra AbtsSara Fluke AVWUJ'WToday's Date: 10/14/2017 Reason for consult: Initial assessment;Other (Comment)(no [name] - Swalili Cedrica 0 #[id number])  Pecola LeisureBaby is 21 hours old  LC reviewed and and updated the doc flow sheets  Baby awake and 1st mom latched the baby. LC noted the baby to be shallow with latch.  LC adjusted the positioning with pillows and noted increased swallows. Per interpreter mom  Comfortable.  WIC came in as LC was finishing up to register baby with WIC.  Per mom never has used a pump, just breastfeeds and no problems.  Mother informed of post-discharge support and given phone number to the lactation department, including services for phone call assistance; out-patient appointments; and breastfeeding support group. List of other breastfeeding resources in the community given in the handout. Encouraged mother to call for problems or concerns related to breastfeeding.    Maternal Data Has patient been taught Hand Expression?: Yes(several drops noted ) Does the patient have breastfeeding experience prior to this delivery?: Yes  Feeding Feeding Type: (per mom baby last fed at 1 pm for 10 mins ) Length of feed: 10 min(per mom reported swallows and comfort )  LATCH Score                   Interventions Interventions: Breast feeding basics reviewed  Lactation Tools Discussed/Used WIC Program: Yes   Consult Status Consult Status: Follow-up Date: 10/15/17 Follow-up type: In-patient    Matilde SprangMargaret Ann Katrin Grabel 10/14/2017, 1:38 PM

## 2017-10-14 NOTE — Progress Notes (Addendum)
Interpreter line used #[id number]  to answer patients questions and reinforced not sleeping with the infant in the bed.  Informed patient of infants increased TcB and of lab coming to draw a serum bili. Ordered patients dinner and patient had no further questions.

## 2017-10-14 NOTE — Progress Notes (Addendum)
Post Partum Day 1  Subjective: no complaints, up ad lib, voiding and breastfeeding is gong well. Pain well controlled with medications. BLeeding equal to menses and has slowed today. Planning Depo for contraception.   Objective: Blood pressure (!) 141/75, pulse 72, temperature 97.7 F (36.5 C), temperature source Axillary, resp. rate 18, height 5\' 5"  (1.651 m), weight 92.5 kg (204 lb), last menstrual period 12/18/2016, SpO2 100 %, unknown if currently breastfeeding.  Physical Exam:  General: alert, cooperative and no distress Lochia: appropriate Uterine Fundus: firm DVT Evaluation: No evidence of DVT seen on physical exam. No cords or calf tenderness. No significant calf/ankle edema.  Recent Labs    10/13/17 0848  HGB 11.1*  HCT 33.0*    Assessment/Plan: Karen Howard is a 37 yo G10 now P 10 0 0 10 s/p SVD at 1159w1d after IOL for A1gDM.   #PPD1: Routine postpartum care, anticipate discharge PPD#2. #Elevated BPs: 2 elevated mild-range Bps over past 24 hours in 140s/70s however one BP reading was shortly after delivery. Continue to monitor and consider gHTN labs if patient's BP trends up.  #A1gDM: fasting CBG 93 today. Plan 6-week GTT. #MOF: breast #contraception: Depo    LOS: 1 day   Karen Howard 10/14/2017, 7:48 AM

## 2017-10-15 DIAGNOSIS — H6120 Impacted cerumen, unspecified ear: Secondary | ICD-10-CM | POA: Diagnosis present

## 2017-10-15 MED ORDER — IBUPROFEN 600 MG PO TABS: 600.0000 mg | ORAL_TABLET | Freq: Four times a day (QID) | ORAL | 0 refills | Status: DC

## 2017-10-15 MED ORDER — CARBAMIDE PEROXIDE 6.5 % OT SOLN: 5.0000 [drp] | Freq: Two times a day (BID) | OTIC | 2 refills | Status: DC

## 2017-10-15 NOTE — Discharge Summary (Addendum)
OB Discharge Summary     Patient Name: Karen Howard DOB: 02-25-1981 MRN: [id number]  Date of admission: 10/13/2017 Delivering MD: Pincus Large   Date of discharge: 10/15/2017  Admitting diagnosis: INDUCTION Intrauterine pregnancy: 106w1d     Secondary diagnosis:  Active Problems:   Supervision of other normal pregnancy, antepartum   AMA (advanced maternal age) multigravida 35+   Sickle cell trait (HCC)   Grand multipara   Gestational diabetes   Language barrier   GDM, class A1   SVD (spontaneous vaginal delivery)  Additional problems:  none     Discharge diagnosis:  Term Pregnancy Delivered and GDM A1    Cerumen impactions, bilateral                                                               Post partum procedures:none  Augmentation: AROM and Pitocin  Complications: None  Hospital course:  Induction of Labor With Vaginal Delivery     37 y.o. yo G10P100010 at [redacted]w[redacted]d was admitted for IOL due to GDMA1 on 10/13/2017. Patient had an uncomplicated labor course as follows:  Membrane Rupture Time/Date: 4:07 PM ,10/13/2017   Intrapartum Procedures: Episiotomy: None [1]                                         Lacerations:  None [1]  Patient had a delivery of a Viable infant. Her CBGs remained within nl limits during labor. 10/13/2017  Information for the patient's newborn:  Teasia, Zapf Girl [place] [[id number]]  Delivery Method: Vaginal, Spontaneous(Filed from Delivery Summary)    Pateint had an uncomplicated postpartum course.  On PPD1 her fasting CBG was nl. She is ambulating, tolerating a regular diet, passing flatus, and urinating well. Patient is discharged home in stable condition on 10/15/17.   Physical exam  Vitals:   10/14/17 0930 10/14/17 1752 10/15/17 0015 10/15/17 0517  BP: 105/71 124/78 117/73 (!) 100/59  Pulse: 77 78 75 76  Resp: 20 18 18 16   Temp: 98.4 F (36.9 C) 98 F (36.7 C) 98.2 F (36.8 C) 98.2 F (36.8 C)  TempSrc: Oral Oral Oral Oral  SpO2:      Weight:       Height:       General: alert, cooperative and no distress HEENT: Bilateral cerumen impaction Lochia: appropriate Uterine Fundus: firm Incision: N/A DVT Evaluation: No evidence of DVT seen on physical exam. Labs: Lab Results  Component Value Date   WBC 10.8 (H) 10/13/2017   HGB 11.1 (L) 10/13/2017   HCT 33.0 (L) 10/13/2017   MCV 73.2 (L) 10/13/2017   PLT 271 10/13/2017   CMP Latest Ref Rng & Units 07/16/2017  Glucose 65 - 99 mg/dL 440(N)  BUN 6 - 20 mg/dL <0(U)  Creatinine 7.25 - 1.00 mg/dL 3.66(Y)  Sodium 403 - 474 mmol/L 137  Potassium 3.5 - 5.1 mmol/L 3.5  Chloride 101 - 111 mmol/L 105  CO2 22 - 32 mmol/L 22  Calcium 8.9 - 10.3 mg/dL 2.5(Z)  Total Protein 6.5 - 8.1 g/dL -  Total Bilirubin 0.3 - 1.2 mg/dL -  Alkaline Phos 38 - 563 U/L -  AST 15 - 41 U/L -  ALT 14 - 54 U/L -    Discharge instruction: per After Visit Summary and "Baby and Me Booklet".  After visit meds:  Allergies as of 10/15/2017      Reactions   Pork-derived Products Swelling      Medication List    STOP taking these medications   ACCU-CHEK FASTCLIX LANCETS Misc   glucose blood test strip     TAKE these medications   carbamide peroxide 6.5 % OTIC solution Commonly known as:  DEBROX Place 5 drops into the right ear 2 (two) times daily.   ibuprofen 600 MG tablet Commonly known as:  ADVIL,MOTRIN Take 1 tablet (600 mg total) by mouth every 6 (six) hours.   VITAFOL ULTRA 29-0.6-0.4-200 MG Caps Take 1 capsule by mouth daily.       Diet: routine diet  Activity: Advance as tolerated. Pelvic rest for 6 weeks.   Outpatient follow up:6 weeks for PP visit, with GTT Follow up Appt:No future appointments. Follow up Visit:No follow-ups on file.  Postpartum contraception: Depo Provera  Newborn Data: Live born female  Birth Weight: 6 lb 8.9 oz (2975 g) APGAR: 9, 9  Newborn Delivery   Birth date/time:  10/13/2017 16:10:00 Delivery type:  Vaginal, Spontaneous     Baby Feeding:  Breast Disposition:home with mother   10/15/2017 Garnette GunnerAaron B Thompson, MD   CNM attestation  **visit conducted with [employer] Swahili interpreter I have seen and examined this patient and agree with above documentation in the resident's note.   Zandra AbtsSara Fridman is a 37 y.o. G10P100010 s/p SVD.   Pain is well controlled.  Plan for birth control is Depo-Provera.  Method of Feeding: breast  PE:  BP (!) 100/59 (BP Location: Right Arm)   Pulse 76   Temp 98.2 F (36.8 C) (Oral)   Resp 16   Ht 5\' 5"  (1.651 m)   Wt 92.5 kg (204 lb)   LMP 12/18/2016 (LMP Unknown)   SpO2 100%   Breastfeeding? Unknown   BMI 33.95 kg/m  Fundus firm  Recent Labs    10/13/17 0848  HGB 11.1*  HCT 33.0*     Plan: discharge today - postpartum care discussed - f/u clinic in 6 weeks for postpartum visit with GTT   Cam HaiSHAW, Tearra Ouk, CNM 11:07 AM 10/15/2017

## 2017-10-15 NOTE — Progress Notes (Signed)
Mom's discharge instructions gone over via interpreter [phone].  Gave Mom bottle of Motrin and explained when to take it and how many to take.  Mom expressed understanding and no further questions.

## 2017-10-15 NOTE — Discharge Instructions (Signed)
Vaginal Delivery, Care After °Refer to this sheet in the next few weeks. These instructions provide you with information about caring for yourself after vaginal delivery. Your health care provider may also give you more specific instructions. Your treatment has been planned according to current medical practices, but problems sometimes occur. Call your health care provider if you have any problems or questions. °What can I expect after the procedure? °After vaginal delivery, it is common to have: °· Some bleeding from your vagina. °· Soreness in your abdomen, your vagina, and the area of skin between your vaginal opening and your anus (perineum). °· Pelvic cramps. °· Fatigue. ° °Follow these instructions at home: °Medicines °· Take over-the-counter and prescription medicines only as told by your health care provider. °· If you were prescribed an antibiotic medicine, take it as told by your health care provider. Do not stop taking the antibiotic until it is finished. °Driving ° °· Do not drive or operate heavy machinery while taking prescription pain medicine. °· Do not drive for 24 hours if you received a sedative. °Lifestyle °· Do not drink alcohol. This is especially important if you are breastfeeding or taking medicine to relieve pain. °· Do not use tobacco products, including cigarettes, chewing tobacco, or e-cigarettes. If you need help quitting, ask your health care provider. °Eating and drinking °· Drink at least 8 eight-ounce glasses of water every day unless you are told not to by your health care provider. If you choose to breastfeed your baby, you may need to drink more water than this. °· Eat high-fiber foods every day. These foods may help prevent or relieve constipation. High-fiber foods include: °? Whole grain cereals and breads. °? Brown rice. °? Beans. °? Fresh fruits and vegetables. °Activity °· Return to your normal activities as told by your health care provider. Ask your health care provider  what activities are safe for you. °· Rest as much as possible. Try to rest or take a nap when your baby is sleeping. °· Do not lift anything that is heavier than your baby or 10 lb (4.5 kg) until your health care provider says that it is safe. °· Talk with your health care provider about when you can engage in sexual activity. This may depend on your: °? Risk of infection. °? Rate of healing. °? Comfort and desire to engage in sexual activity. °Vaginal Care °· If you have an episiotomy or a vaginal tear, check the area every day for signs of infection. Check for: °? More redness, swelling, or pain. °? More fluid or blood. °? Warmth. °? Pus or a bad smell. °· Do not use tampons or douches until your health care provider says this is safe. °· Watch for any blood clots that may pass from your vagina. These may look like clumps of dark red, brown, or black discharge. °General instructions °· Keep your perineum clean and dry as told by your health care provider. °· Wear loose, comfortable clothing. °· Wipe from front to back when you use the toilet. °· Ask your health care provider if you can shower or take a bath. If you had an episiotomy or a perineal tear during labor and delivery, your health care provider may tell you not to take baths for a certain length of time. °· Wear a bra that supports your breasts and fits you well. °· If possible, have someone help you with household activities and help care for your baby for at least a few days after   you leave the hospital. °· Keep all follow-up visits for you and your baby as told by your health care provider. This is important. °Contact a health care provider if: °· You have: °? Vaginal discharge that has a bad smell. °? Difficulty urinating. °? Pain when urinating. °? A sudden increase or decrease in the frequency of your bowel movements. °? More redness, swelling, or pain around your episiotomy or vaginal tear. °? More fluid or blood coming from your episiotomy or  vaginal tear. °? Pus or a bad smell coming from your episiotomy or vaginal tear. °? A fever. °? A rash. °? Little or no interest in activities you used to enjoy. °? Questions about caring for yourself or your baby. °· Your episiotomy or vaginal tear feels warm to the touch. °· Your episiotomy or vaginal tear is separating or does not appear to be healing. °· Your breasts are painful, hard, or turn red. °· You feel unusually sad or worried. °· You feel nauseous or you vomit. °· You pass large blood clots from your vagina. If you pass a blood clot from your vagina, save it to show to your health care provider. Do not flush blood clots down the toilet without having your health care provider look at them. °· You urinate more than usual. °· You are dizzy or light-headed. °· You have not breastfed at all and you have not had a menstrual period for 12 weeks after delivery. °· You have stopped breastfeeding and you have not had a menstrual period for 12 weeks after you stopped breastfeeding. °Get help right away if: °· You have: °? Pain that does not go away or does not get better with medicine. °? Chest pain. °? Difficulty breathing. °? Blurred vision or spots in your vision. °? Thoughts about hurting yourself or your baby. °· You develop pain in your abdomen or in one of your legs. °· You develop a severe headache. °· You faint. °· You bleed from your vagina so much that you fill two sanitary pads in one hour. °This information is not intended to replace advice given to you by your health care provider. Make sure you discuss any questions you have with your health care provider. °Document Released: 04/24/2000 Document Revised: 10/09/2015 Document Reviewed: 05/12/2015 °Elsevier Interactive Patient Education © 2018 Elsevier Inc. ° °

## 2017-10-15 NOTE — Clinical Social Work Maternal (Signed)
CLINICAL SOCIAL WORK MATERNAL/CHILD NOTE  Patient Details  Name: Karen Howard MRN: [id number] Date of Birth: [birth year]  Date:  10/15/2017  Clinical Social Worker Initiating Note:  Glenard Haring Boyd-Gilyard Date/Time: Initiated:  10/14/17/1536     Child's Name:  Karen Howard had not named infant when assessment was completed.   Biological Parents:  Mother, Father   Need for Interpreter:  Swahili   Reason for Referral:  Late or No Prenatal Care    Address:  [place] [address]    Phone number:  [phone] (home)     Additional phone number:  Household Members/Support Persons (HM/SP):   (Karen Howard has 9 older children and 1 grandchild that resides in the home with Karen Howard and FOB.)   HM/SP Name Relationship DOB or Age  HM/SP -1        HM/SP -2        HM/SP -3        HM/SP -4        HM/SP -5        HM/SP -6        HM/SP -7        HM/SP -8          Natural Supports (not living in the home):  [employer], Radiographer, therapeutic Supports: Organized support group (Comment)   Employment: Unemployed   Type of Work:     Education:      Homebound arranged:    Museum/gallery curator Resources:  Medicaid   Other Resources:  Physicist, medical , [place] Considerations Which May Impact Care:  None Reported  Strengths:  Ability to meet basic needs , Home prepared for child    Psychotropic Medications:         Pediatrician:       Pediatrician List:   [place]      Pediatrician Fax Number:    Risk Factors/Current Problems:  None   Cognitive State:  Alert , Able to Concentrate , Linear Thinking    Mood/Affect:  Relaxed , Comfortable , Calm , Interested    CSW Assessment: CSW met with Karen Howard in room 135 to complete an assessment for late [facility]. When CSW arrived, Karen Howard was asleep in bed and infant was asleep in Karen Howard's arms. CSW immediately woke Karen Howard and with the use of [place]  interpreting services CSW provided Karen Howard with detailed education regarding SIDS. Karen Howard was not receptive to the information and communicated, "This is my 10th baby and I have slept with all of my children in my bed." CSW reinforced safe sleep and shared with Karen Howard that if Karen Howard is observed co-sleeping with infant again while in the hospital, [place] will make a report to [place].  Karen Howard appeared annoyed as evidence by rolling her eyes and decreasing eye contact with CSW.   CSW asked about Karen Howard's late [facility] and Karen Howard shared that Karen Howard did not have insurance and she could not find an OB Provider that would see her with pending Medicaid.  Karen Howard denied barriers to follow-up appointment for Karen Howard and infant.    CSW explained hospital's late [facility] policy and Karen Howard was understanding. Karen Howard denied the use of all illicit substance and CPS hx. Karen Howard is aware that CSW will continue to monitor infants UDS and CDS and if either are positive without an explanation, CSW will make a  report to [place].  Karen Howard reported having all essential items needed for infant.  Karen Howard shared that the family has a bassinet and FOB plans to purchase a car seat prior to infant's discharge.   CSW thanked Karen Howard for meeting with CSW.   CSW Plan/Description:  No Further Intervention Required/No Barriers to Discharge, Sudden Infant Death Syndrome (SIDS) Education, Other Information/Referral to [employer], Perinatal Mood and Anxiety Disorder (PMADs) Education, [place], CSW Will Continue to Monitor Umbilical Cord Tissue Drug Screen Results and Make Report if Warranted   Laurey Arrow, MSW, LCSW Clinical Social Work [phone]   Dimple Nanas, [place] 10/15/2017, 9:41 AM

## 2017-10-22 DIAGNOSIS — O24410 Gestational diabetes mellitus in pregnancy, diet controlled: Secondary | ICD-10-CM

## 2017-10-22 LAB — GLUCOSE, POCT (MANUAL RESULT ENTRY)
POC GLUCOSE: 79 mg/dL (ref 70–99)
POC Glucose: 79 mg/dl (ref 70–99)

## 2017-10-22 NOTE — Congregational Nurse Program (Signed)
Congregational Nurse Program Note  Date of Encounter: 10/22/2017  Past Medical History: Past Medical History:  Diagnosis Date  . Gestational diabetes   . Hypertension     Encounter Details: CNP Questionnaire - 10/22/17 1227      Questionnaire   Patient Status  Refugee    Race  African    Location Patient Served At  Not Applicable    Insurance  Medicaid    Uninsured  Not Applicable    Food  No food insecurities    Housing/Utilities  Yes, have permanent housing    Transportation  No transportation needs    Interpersonal Safety  Yes, feel physically and emotionally safe where you currently live    Medication  No medication insecurities    Medical Provider  Yes    Referrals  Not Applicable    ED Visit Averted  Not Applicable    Life-Saving Intervention Made  Not Applicable      Clinical Intake - 10/07/17 0840      Language Assistant   Interpreter Needed?  Yes    Interpreter Agency  language resources    Interpreter Name  Duffy BruceFani Mwasiti     Ms Karen Howard came to the center with post discharge questions. She delivered  a baby girl a week ago.She was induced due to gestational diabetes and was concerned about blood sugar levels. She states that she has glucometer and supplies but did not understand how and when to check blood sugar.Education provided regarding diabetes in pregnancy.I will be checking her blood sugars here at the center twice a week and will provide education on testing blood sugar at home. Karen Cellaorothy Samra Pesch RN BSN PCCN CNP [phone]

## 2017-11-01 DIAGNOSIS — O24410 Gestational diabetes mellitus in pregnancy, diet controlled: Secondary | ICD-10-CM

## 2017-11-01 LAB — GLUCOSE, POCT (MANUAL RESULT ENTRY): POC Glucose: 93 mg/dl (ref 70–99)

## 2017-11-01 NOTE — Congregational Nurse Program (Signed)
  Congregational Nurse Program Note  Date of Encounter: 11/01/2017  Past Medical History: Past Medical History:  Diagnosis Date  . Gestational diabetes   . Hypertension     Encounter Details: CNP Questionnaire - 11/01/17 1309      Questionnaire   Patient Status  Refugee    Race  African    Location Patient Served At  Not Applicable    Insurance  Medicaid    Uninsured  Not Applicable    Food  No food insecurities    Housing/Utilities  Yes, have permanent housing    Transportation  No transportation needs    Interpersonal Safety  Yes, feel physically and emotionally safe where you currently live    Medication  No medication insecurities    Referrals  Not Applicable    ED Visit Averted  Not Applicable    Life-Saving Intervention Made  Not Applicable     Ms Karen Howard came to the center for blood sugar check. She would also like to make an appointment 6 weeks post partum. Karen Cellaorothy Muhoro RN BSN PCCN CNP  [phone]

## 2017-11-05 DIAGNOSIS — O99810 Abnormal glucose complicating pregnancy: Secondary | ICD-10-CM

## 2017-11-05 LAB — GLUCOSE, POCT (MANUAL RESULT ENTRY): POC Glucose: 99 mg/dl (ref 70–99)

## 2017-11-05 NOTE — Congregational Nurse Program (Signed)
Congregational Nurse Program Note  Date of Encounter: 11/05/2017  Past Medical History: Past Medical History:  Diagnosis Date  . Gestational diabetes   . Hypertension     Encounter Details: CNP Questionnaire - 11/05/17 1406      Questionnaire   Patient Status  Refugee    Race  African    Location Patient Served At  Not Applicable    Insurance  Medicaid    Uninsured  Not Applicable    Food  No food insecurities    Housing/Utilities  No permanent housing    Transportation  No transportation needs    Interpersonal Safety  Yes, feel physically and emotionally safe where you currently live    Medication  No medication insecurities    Medical Provider  Yes    Referrals  Not Applicable    ED Visit Averted  Not Applicable    Life-Saving Intervention Made  Not Applicable     Ms Karen Howard has been scheduled for 6 weeks postpartum check July 18th 10;15am Nicole Cellaorothy Muhoro RN BSN PCCN CNP

## 2017-11-25 ENCOUNTER — Ambulatory Visit (INDEPENDENT_AMBULATORY_CARE_PROVIDER_SITE_OTHER): Payer: BLUE CROSS/BLUE SHIELD | Admitting: Obstetrics and Gynecology

## 2017-11-25 ENCOUNTER — Encounter: Payer: Self-pay | Admitting: Obstetrics and Gynecology

## 2017-11-25 VITALS — BP 125/78 | HR 68 | Ht 65.0 in | Wt 199.7 lb

## 2017-11-25 DIAGNOSIS — Z1389 Encounter for screening for other disorder: Secondary | ICD-10-CM | POA: Diagnosis not present

## 2017-11-25 DIAGNOSIS — Z3042 Encounter for surveillance of injectable contraceptive: Secondary | ICD-10-CM | POA: Diagnosis not present

## 2017-11-25 DIAGNOSIS — Z309 Encounter for contraceptive management, unspecified: Secondary | ICD-10-CM | POA: Insufficient documentation

## 2017-11-25 DIAGNOSIS — O99810 Abnormal glucose complicating pregnancy: Secondary | ICD-10-CM

## 2017-11-25 DIAGNOSIS — Z30013 Encounter for initial prescription of injectable contraceptive: Secondary | ICD-10-CM

## 2017-11-25 MED ORDER — MEDROXYPROGESTERONE ACETATE 150 MG/ML IM SUSP
150.0000 mg | Freq: Once | INTRAMUSCULAR | Status: AC
  Administered 2017-11-25: 150 mg via INTRAMUSCULAR

## 2017-11-25 MED ORDER — MEDROXYPROGESTERONE ACETATE 150 MG/ML IM SUSP
150.0000 mg | INTRAMUSCULAR | 3 refills | Status: DC
Start: 2017-11-25 — End: 2017-11-25

## 2017-11-25 NOTE — Addendum Note (Signed)
Addended by: Henrietta DineNEAL, Karri Kallenbach S on: 11/25/2017 05:00 PM   Modules accepted: Orders

## 2017-11-25 NOTE — Patient Instructions (Signed)

## 2017-11-25 NOTE — Progress Notes (Signed)
Subjective:     Zandra AbtsSara Kerner is a 37 y.o. female who presents for a postpartum visit. She is 6 weeks postpartum following a spontaneous vaginal delivery. I have fully reviewed the prenatal and intrapartum course. The delivery was at 1 gestational weeks. Outcome: spontaneous vaginal delivery. Anesthesia: none. Postpartum course has been unremarkable. Baby's course has been unremarkable. Baby is feeding by breast. Bleeding no bleeding. Bowel function is normal. Bladder function is normal. Patient is sexually active. Contraception method is none. Postpartum depression screening: negative.  The following portions of the patient's history were reviewed and updated as appropriate: allergies, current medications, past family history, past medical history, past social history and past surgical history.  Review of Systems Pertinent items are noted in HPI.   Objective:    LMP 12/18/2016 (LMP Unknown)   General:  alert   Breasts:  not examined  Lungs: clear to auscultation bilaterally  Heart:  regular rate and rhythm, S1, S2 normal, no murmur, click, rub or gallop  Abdomen: soft, non-tender; bowel sounds normal; no masses,  no organomegaly   Vulva:  not evaluated  Vagina: not evaluated  Cervix:  not examined  Corpus: not examined  Adnexa:  not evaluated  Rectal Exam: Not performed.        Assessment:     NL  postpartum exam. Pap smear up to date Contraception Management H/O GDM  Plan:    1. Contraception: Depo-Provera injections 2. Return to normal ADL's. Will schedule for 2 hr GTT d/t H/O GDM 3. Follow up in:q 12 weeks for Depo Provera, 2 hr GTT as noted and in 1 yr or PRN

## 2017-12-02 ENCOUNTER — Other Ambulatory Visit: Payer: BLUE CROSS/BLUE SHIELD

## 2017-12-02 DIAGNOSIS — O99810 Abnormal glucose complicating pregnancy: Secondary | ICD-10-CM

## 2017-12-03 LAB — GLUCOSE TOLERANCE, 2 HOURS W/ 1HR
GLUCOSE, 2 HOUR: 83 mg/dL (ref 65–152)
Glucose, 1 hour: 136 mg/dL (ref 65–179)
Glucose, Fasting: 113 mg/dL — ABNORMAL HIGH (ref 65–91)

## 2017-12-08 ENCOUNTER — Telehealth: Payer: Self-pay | Admitting: *Deleted

## 2017-12-08 DIAGNOSIS — O99810 Abnormal glucose complicating pregnancy: Secondary | ICD-10-CM

## 2017-12-08 NOTE — Telephone Encounter (Signed)
-----   Message from Hermina StaggersMichael L Ervin, MD sent at 12/03/2017  8:52 AM EDT ----- Pt needs to see PCP for abnormal postpartum glucola Thanks Casimiro NeedleMichael

## 2017-12-08 NOTE — Telephone Encounter (Signed)
Corky Mullalled Meoshia [facility] [phone] and notified her that her postpartum glucose test was elevated indicating she has diabetes and she should see a PCP. She states she saw a nurse in another office who told her she wasn't diabetic.  I explained our tests indicates she is and needs to follow up with a PCP who may do another test to confirm. I asked if she has a PCP and if not we can make a referral . She asks me to make her a referral and to send her a text with that information. I explained I will make a referral to  [facility] and will send her  A letter since I cannot send her a text( not on MyChart). I also instructed her to call us if she does not get an appointment with them by 3 weeks. She voices understanding.

## 2017-12-10 DIAGNOSIS — O99810 Abnormal glucose complicating pregnancy: Secondary | ICD-10-CM

## 2017-12-10 LAB — GLUCOSE, POCT (MANUAL RESULT ENTRY): POC Glucose: 111 mg/dl — AB (ref 70–99)

## 2017-12-10 NOTE — Congregational Nurse Program (Signed)
Congregational Nurse Program Note  Date of Encounter: 12/10/2017  Past Medical History: Past Medical History:  Diagnosis Date  . Gestational diabetes   . Hypertension     Encounter Details: CNP Questionnaire - 12/10/17 1412      Questionnaire   Patient Status  Refugee    Race  African    Location Patient Served At  Not Applicable    Insurance  Not Applicable    Uninsured  Uninsured (NEW 1x/quarter)    Food  No food insecurities    Housing/Utilities  Yes, have permanent housing    Transportation  No transportation needs    Interpersonal Safety  Yes, feel physically and emotionally safe where you currently live    Medication  Yes, have medication insecurities    Medical Provider  No    Referrals  Not Applicable    ED Visit Averted  Not Applicable    Life-Saving Intervention Made  Not Applicable     client came in for blood sugar check. She reports blood sugar increase during pregnancy. She is now 6 weeks post partum. Health education provided on diabetes,diet and exercise Arman BogusDorothy Detron Carras RN [facility]  BSN CNP  [phone]

## 2017-12-12 NOTE — Progress Notes (Signed)
This encounter was created in error - please disregard.  This encounter was created in error - please disregard.  This encounter was created in error - please disregard.  This encounter was created in error - please disregard.  This encounter was created in error - please disregard.  This encounter was created in error - please disregard.  This encounter was created in error - please disregard.  This encounter was created in error - please disregard.

## 2018-02-02 ENCOUNTER — Ambulatory Visit: Payer: Self-pay | Attending: Family Medicine | Admitting: Family Medicine

## 2018-02-02 ENCOUNTER — Encounter: Payer: Self-pay | Admitting: Family Medicine

## 2018-02-02 VITALS — BP 136/87 | HR 68 | Temp 98.8°F | Resp 16 | Ht 63.0 in | Wt 214.0 lb

## 2018-02-02 DIAGNOSIS — H6121 Impacted cerumen, right ear: Secondary | ICD-10-CM

## 2018-02-02 DIAGNOSIS — H9191 Unspecified hearing loss, right ear: Secondary | ICD-10-CM

## 2018-02-02 DIAGNOSIS — Z91018 Allergy to other foods: Secondary | ICD-10-CM | POA: Insufficient documentation

## 2018-02-02 DIAGNOSIS — H6123 Impacted cerumen, bilateral: Secondary | ICD-10-CM

## 2018-02-02 DIAGNOSIS — Z8632 Personal history of gestational diabetes: Secondary | ICD-10-CM

## 2018-02-02 DIAGNOSIS — D649 Anemia, unspecified: Secondary | ICD-10-CM | POA: Insufficient documentation

## 2018-02-02 DIAGNOSIS — D573 Sickle-cell trait: Secondary | ICD-10-CM | POA: Insufficient documentation

## 2018-02-02 LAB — POCT GLYCOSYLATED HEMOGLOBIN (HGB A1C): Hemoglobin A1C: 5.3 % (ref 4.0–5.6)

## 2018-02-02 LAB — GLUCOSE, POCT (MANUAL RESULT ENTRY): POC Glucose: 103 mg/dL — AB (ref 70–99)

## 2018-02-02 NOTE — Progress Notes (Signed)
Subjective:    Patient ID: Karen Howard, female    DOB: [birth year], 37 y.o.   MRN: [id number]  HPI 37 yo female who is new to the practice. Patient with a history of gestational diabetes and anemia with sickle cell trait. Patient reports that she has had decreased hearing in her right ear for about 4 months and no cannot really hear out of her right ear. Patient reports no ear pain. She was able to get some material out of her right ear at one point a few months ago.       Patient with  Gestational diabetes during pregnancy but denies any current issues with increased thirst, no urinary frequency and no excessive fatigue. Patient was on medication for her blood pressure in the past but no longer needs to take it. Patient denies any headaches or dizziness.  Patient does not smoke or drink. 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 Patient denies any family history of DM, HTN, CAD, CVA's or cancers.  Past Medical History:  Diagnosis Date  . Gestational diabetes   . Hypertension    Past Surgical History:  Procedure Laterality Date  . NO PAST SURGERIES     Social History   Tobacco Use  . Smoking status: Never Smoker  . Smokeless tobacco: Never Used  Substance Use Topics  . Alcohol use: No  . Drug use: No   Allergies  Allergen Reactions  . Pork-Derived Products Swelling    Review of Systems  Constitutional: Negative for chills and fever.  HENT: Positive for hearing loss. Negative for ear pain.   Respiratory: Negative for cough and shortness of breath.   Cardiovascular: Negative for chest pain, palpitations and leg swelling.  Gastrointestinal: Negative for abdominal pain and nausea.  Endocrine: Negative for polydipsia, polyphagia and polyuria.  Genitourinary: Negative for dysuria and frequency.  Musculoskeletal: Negative for arthralgias and back pain.  Neurological: Negative for dizziness and headaches.       Objective:   Physical Exam BP 136/87   Pulse 68   Temp 98.8 F (37.1 C) (Oral)   Resp 16   Ht 5\' 3"  (1.6 m)   Wt 214 lb (97.1 kg)   SpO2 96%   BMI 37.91 kg/m  nurses notes and vital signs reviewed Gen- [facility]NWD  Female in NAD who is accompanied by an interpreter at today's visit ENT- patient with bilateral cerumen impaction in the ear canals and the wax in the right canal is hard, compacted and pushed back into the canal; wax on left is firm but not as hard as on the right; moderate edema of the nasal turbinates which are pink, patient with a large tongue which limited evaluation of the posterior OP but it appeared to be within normal Neck- supple, no LAD and no thyromegaly Lungs- CTA ABD- soft and nontender Back- no CVA tenderness Ext- no edema      Assessment & Plan:  1. Decreased hearing of right ear Patient with impacted cerumen which is likely the cause of her hearing loss. Patient will have ear lavage by CMA for removal of cerumen  2. History of gestational diabetes Will check glucose and HgbA1c due to history of gestational diabetes. Hgb A1c was normal today at 5.3 - Glucose (CBG) - HgB A1c  3. Bilateral impacted cerumen Patient with bilateral impaction of cerumen and current right sided hearing loss due to the impaction- wax on the right  is packed against the TM; patient will have Debrox placed into the ears and then ear lavage  4. Hearing loss of right ear due to cerumen impaction Hearing loss secondary to presence of cerumen and patient encouraged to not use Q-tips which can cause wax to be pushed back into the ear canal and lead to build-up and impaction  An After Visit Summary was printed and given to the patient.  Return in about 4 months (around 06/04/2018), or if symptoms worsen or fail to improve.

## 2018-02-02 NOTE — Progress Notes (Signed)
Pt states she can't hear out of her right ear

## 2018-02-03 ENCOUNTER — Encounter: Payer: Self-pay | Admitting: Family Medicine

## 2018-02-10 ENCOUNTER — Ambulatory Visit (INDEPENDENT_AMBULATORY_CARE_PROVIDER_SITE_OTHER): Payer: Medicaid Other

## 2018-02-10 VITALS — Wt 213.0 lb

## 2018-02-10 DIAGNOSIS — Z3042 Encounter for surveillance of injectable contraceptive: Secondary | ICD-10-CM

## 2018-02-10 MED ORDER — MEDROXYPROGESTERONE ACETATE 150 MG/ML IM SUSP
150.0000 mg | Freq: Once | INTRAMUSCULAR | Status: AC
  Administered 2018-02-10: 150 mg via INTRAMUSCULAR

## 2018-02-10 NOTE — Progress Notes (Signed)
Karen Howard here for Depo-Provera  Injection.  Injection administered without complication. Patient will return in 3 months for next injection. Used interpreter [employer] [address]    [name] [name], CMA 02/10/2018  10:55 AM

## 2018-04-18 ENCOUNTER — Telehealth: Payer: Self-pay

## 2018-04-18 NOTE — Telephone Encounter (Signed)
Ms Karen Howard came in requesting interpretation of her medical bills. She states that she is covered by medicaid. I have called [place] [facility] billing and medicaid only covers billing related to family planing. I have explained to her and she verbalized understanding. Arman Bogusorothy Muhoro RN BSN PCCN  [phone]

## 2018-04-28 ENCOUNTER — Ambulatory Visit (INDEPENDENT_AMBULATORY_CARE_PROVIDER_SITE_OTHER): Payer: Medicaid Other | Admitting: *Deleted

## 2018-04-28 VITALS — BP 136/88 | HR 86

## 2018-04-28 DIAGNOSIS — Z3042 Encounter for surveillance of injectable contraceptive: Secondary | ICD-10-CM | POA: Diagnosis present

## 2018-04-28 MED ORDER — MEDROXYPROGESTERONE ACETATE 150 MG/ML IM SUSP
150.0000 mg | Freq: Once | INTRAMUSCULAR | Status: AC
  Administered 2018-04-28: 150 mg via INTRAMUSCULAR

## 2018-04-28 NOTE — Progress Notes (Addendum)
Zandra AbtsSara Sonntag here for Depo-Provera  Injection.  Injection administered without complication. Patient will return in 3 months for next injection.  Linda,RN 04/28/2018  10:36 AM   Attestation of Attending Supervision of RN: Evaluation and management procedures were performed by the nurse under my supervision and collaboration.  I have reviewed the nursing note and chart, and I agree with the management and plan.  Carolyn L. Harraway-Smith, M.D., [facility] CoreFACOG

## 2018-07-15 ENCOUNTER — Ambulatory Visit (INDEPENDENT_AMBULATORY_CARE_PROVIDER_SITE_OTHER): Payer: Medicaid Other

## 2018-07-15 VITALS — BP 150/82 | HR 96

## 2018-07-15 DIAGNOSIS — Z3042 Encounter for surveillance of injectable contraceptive: Secondary | ICD-10-CM | POA: Diagnosis not present

## 2018-07-15 MED ORDER — MEDROXYPROGESTERONE ACETATE 150 MG/ML IM SUSP
150.0000 mg | Freq: Once | INTRAMUSCULAR | Status: AC
  Administered 2018-07-15: 150 mg via INTRAMUSCULAR

## 2018-07-15 NOTE — Progress Notes (Addendum)
Karen Howard here for Depo-Provera  Injection.  Injection administered without complication. Patient will return in 3 months for next injection. Video Interpreter # [phone] Ralene Bathe, RN 07/15/2018  10:30 AM     '

## 2018-09-30 ENCOUNTER — Ambulatory Visit: Payer: Medicaid Other

## 2022-02-06 ENCOUNTER — Inpatient Hospital Stay (HOSPITAL_COMMUNITY)
Admission: AD | Admit: 2022-02-06 | Discharge: 2022-02-07 | Disposition: A | Discharge status: Home / Self Care | Payer: Medicaid Other | Attending: Obstetrics & Gynecology | Admitting: Obstetrics & Gynecology

## 2022-02-06 ENCOUNTER — Encounter (HOSPITAL_COMMUNITY): Payer: Self-pay | Admitting: *Deleted

## 2022-02-06 ENCOUNTER — Inpatient Hospital Stay (HOSPITAL_COMMUNITY): Payer: Medicaid Other

## 2022-02-06 DIAGNOSIS — Z3A01 Less than 8 weeks gestation of pregnancy: Secondary | ICD-10-CM | POA: Diagnosis not present

## 2022-02-06 DIAGNOSIS — O3680X0 Pregnancy with inconclusive fetal viability, not applicable or unspecified: Secondary | ICD-10-CM | POA: Diagnosis not present

## 2022-02-06 DIAGNOSIS — O209 Hemorrhage in early pregnancy, unspecified: Secondary | ICD-10-CM | POA: Diagnosis not present

## 2022-02-06 DIAGNOSIS — O09521 Supervision of elderly multigravida, first trimester: Secondary | ICD-10-CM | POA: Insufficient documentation

## 2022-02-06 DIAGNOSIS — Z789 Other specified health status: Secondary | ICD-10-CM

## 2022-02-06 LAB — WET PREP, GENITAL
Sperm: NONE SEEN
Trich, Wet Prep: NONE SEEN
WBC, Wet Prep HPF POC: <10 (ref <10)
Yeast Wet Prep HPF POC: NONE SEEN

## 2022-02-06 NOTE — MAU Note (Signed)
Pt says with interpreter- Swahili- No VB- x4 years - No LMP HPT-positive  Here for spotting - Thursday night - at 11pm

## 2022-02-06 NOTE — MAU Provider Note (Signed)
Chief Complaint: Vaginal Bleeding   Event Date/Time   First Provider Initiated Contact with Patient 02/06/22 2259      SUBJECTIVE HPI: Karen Howard is a 42 y.o. [id number] at Unknown without recent LMP with symptoms of pregnancy including feeling fetal movement within the last 3 weeks, then onset of light bleeding today and a positive pregnancy test at home.  She reports that after her last baby in 2019, she used Depo Provera for contraception until 2020, when she did not return for injections due to the pandemic.  She never resumed having periods. She thought this must be menopause. Last year, in [place], she had a miscarriage at around 3 months of pregnancy and had so much bleeding that she lost consciousness.  She had no more vaginal bleeding after this pregnancy loss, so is not sure how far along she is now since she is not having periods.      HPI  Past Medical History:  Diagnosis Date   Gestational diabetes    Hypertension    Past Surgical History:  Procedure Laterality Date   NO PAST SURGERIES     Social History   Socioeconomic History   Marital status: Married    Spouse name: Not on file   Number of children: Not on file   Years of education: Not on file   Highest education level: Not on file  Occupational History   Not on file  Tobacco Use   Smoking status: Never   Smokeless tobacco: Never  Substance and Sexual Activity   Alcohol use: No   Drug use: No   Sexual activity: Yes  Other Topics Concern   Not on file  Social History Narrative   Not on file   Social Determinants of Health   Financial Resource Strain: Not on file  Food Insecurity: Not on file  Transportation Needs: Not on file  Physical Activity: Not on file  Stress: Not on file  Social Connections: Not on file  Intimate Partner Violence: Not on file   No current facility-administered medications on file prior to encounter.   Current Outpatient Medications on File Prior to Encounter  Medication  Sig Dispense Refill   ibuprofen (ADVIL,MOTRIN) 600 MG tablet Take 1 tablet (600 mg total) by mouth every 6 (six) hours. 30 tablet 0   Prenat-Fe Poly-Methfol-FA-DHA (VITAFOL ULTRA) 29-0.6-0.4-200 MG CAPS Take 1 capsule by mouth daily.  11   Allergies  Allergen Reactions   Pork-Derived Products Swelling    ROS:  Review of Systems  Constitutional:  Negative for chills, fatigue and fever.  Respiratory:  Negative for shortness of breath.   Cardiovascular:  Negative for chest pain.  Gastrointestinal:  Negative for abdominal pain, nausea and vomiting.  Genitourinary:  Positive for vaginal bleeding. Negative for difficulty urinating, dysuria, flank pain, pelvic pain, vaginal discharge and vaginal pain.  Neurological:  Negative for dizziness and headaches.  Psychiatric/Behavioral: Negative.       I have reviewed patient's Past Medical Hx, Surgical Hx, Family Hx, Social Hx, medications and allergies.   Physical Exam  Patient Vitals for the past 24 hrs:  BP Temp Temp src Pulse Resp Height Weight  02/07/22 0111 (!) 163/70 -- Oral 60 -- -- --  02/06/22 2045 (!) 164/70 (!) 97.5 F (36.4 C) Oral (!) 59 18 5\' 4"  (1.626 m) 99.9 kg   Constitutional: Well-developed, well-nourished female in no acute distress.  Cardiovascular: normal rate Respiratory: normal effort GI: Abd soft, non-tender. Pos BS x 4 MS: Extremities nontender,  no edema, normal ROM Neurologic: Alert and oriented x 4.  GU: Neg CVAT.  PELVIC EXAM: Deferred, vaginal cultures collected by blind swab  RN unable to obtain FHT by doppler  LAB RESULTS Results for orders placed or performed during the hospital encounter of 02/06/22 (from the past 24 hour(s))  hCG, quantitative, pregnancy     Status: Abnormal   Collection Time: 02/06/22 11:19 PM  Result Value Ref Range   hCG, Beta Chain, Quant, S 6,076 (H) <5 mIU/mL  Wet prep, genital     Status: Abnormal   Collection Time: 02/06/22 11:20 PM  Result Value Ref Range   Yeast Wet  Prep HPF POC NONE SEEN NONE SEEN   Trich, Wet Prep NONE SEEN NONE SEEN   Clue Cells Wet Prep HPF POC PRESENT (A) NONE SEEN   WBC, Wet Prep HPF POC <10 <10   Sperm NONE SEEN   CBC     Status: Abnormal   Collection Time: 02/07/22 12:06 AM  Result Value Ref Range   WBC 8.0 4.0 - 10.5 K/uL   RBC 4.06 3.87 - 5.11 MIL/uL   Hemoglobin 11.7 (L) 12.0 - 15.0 g/dL   HCT 63.3 (L) 35.4 - 56.2 %   MCV 82.8 80.0 - 100.0 fL   MCH 28.8 26.0 - 34.0 pg   MCHC 34.8 30.0 - 36.0 g/dL   RDW 56.3 89.3 - 73.4 %   Platelets 285 150 - 400 K/uL   nRBC 0.0 0.0 - 0.2 %       IMAGING US OB LESS THAN 14 WEEKS WITH OB TRANSVAGINAL  Result Date: 02/06/2022 CLINICAL DATA:  Spotting for 1 day. EXAM: OBSTETRIC <14 WK [place] AND TRANSVAGINAL OB US TECHNIQUE: Both transabdominal and transvaginal ultrasound examinations were performed for complete evaluation of the gestation as well as the maternal uterus, adnexal regions, and pelvic cul-de-sac. Transvaginal technique was performed to assess early pregnancy. COMPARISON:  None Available. FINDINGS: Intrauterine gestational sac: Single Yolk sac:  Yes, 14.6 mm Embryo:  No Cardiac Activity: No Heart Rate: None MSD: 24.1 mm   7 w   3 d Subchorionic hemorrhage:  None visualized. Maternal uterus/adnexae: The left ovary is within normal limits. The right ovary is not visualized on exam. No free fluid in the pelvis. IMPRESSION: Single intrauterine gestational sac with yolk sac, estimated gestational age [redacted] weeks 3 days. No fetal pole or fetal cardiac activity is identified, concerning for failed early pregnancy. Correlation with beta HCG is recommended. Electronically Signed   By: Thornell Sartorius M.D.   On: 02/06/2022 23:53    MAU Management/MDM: Orders Placed This Encounter  Procedures   Wet prep, genital   US OB LESS THAN 14 WEEKS WITH OB TRANSVAGINAL   US OB Transvaginal   hCG, quantitative, pregnancy   CBC   Discharge patient    No orders of the defined types were placed in this  encounter.   IUP noted on [place] today with gestational sac and yolk sac and size of gestational sac suspicious but not definitive for failed pregnancy.  Consult Dr Despina Hidden with assessment and findings.  F/U in 10-14 days for outpatient [place]. Return sooner to MAU with heavy bleeding or abdominal pain.   Swahili interpreter on language line used for all communication.  ASSESSMENT 1. Pregnancy with inconclusive fetal viability, single or unspecified fetus   2. Vaginal bleeding in pregnancy, first trimester   3. [redacted] weeks gestation of pregnancy   4. Language barrier     PLAN Discharge  home with bleeding precautions  Allergies as of 02/07/2022       Reactions   Pork-derived Products Swelling        Medication List     STOP taking these medications    ibuprofen 600 MG tablet Commonly known as: ADVIL       TAKE these medications    Vitafol Ultra 29-0.6-0.4-200 MG Caps Take 1 capsule by mouth daily.        Follow-up Information     [facility] Follow up.   Specialty: Radiology Contact information: [address] [place] [address] [facility] Follow up.   Specialty: Obstetrics and Gynecology Why: As needed for emergencies Contact information: [address] [phone] [place] Certified Nurse-Midwife 02/07/2022  1:20 AM

## 2022-02-07 DIAGNOSIS — O3680X0 Pregnancy with inconclusive fetal viability, not applicable or unspecified: Secondary | ICD-10-CM | POA: Diagnosis not present

## 2022-02-07 LAB — CBC
HCT: 33.6 % — ABNORMAL LOW (ref 36.0–46.0)
Hemoglobin: 11.7 g/dL — ABNORMAL LOW (ref 12.0–15.0)
MCH: 28.8 pg (ref 26.0–34.0)
MCHC: 34.8 g/dL (ref 30.0–36.0)
MCV: 82.8 fL (ref 80.0–100.0)
Platelets: 285 10*3/uL (ref 150–400)
RBC: 4.06 MIL/uL (ref 3.87–5.11)
RDW: 13.4 % (ref 11.5–15.5)
WBC: 8 10*3/uL (ref 4.0–10.5)
nRBC: 0 % (ref 0.0–0.2)

## 2022-02-07 LAB — HCG, QUANTITATIVE, PREGNANCY: hCG, Beta Chain, Quant, S: 6076 m[IU]/mL — ABNORMAL HIGH (ref <5)

## 2022-02-08 ENCOUNTER — Inpatient Hospital Stay (HOSPITAL_COMMUNITY)
Admission: AD | Admit: 2022-02-08 | Discharge: 2022-02-08 | Disposition: A | Discharge status: Home / Self Care | Payer: Medicaid Other | Attending: Obstetrics and Gynecology | Admitting: Obstetrics and Gynecology

## 2022-02-08 ENCOUNTER — Inpatient Hospital Stay (HOSPITAL_COMMUNITY): Payer: Medicaid Other

## 2022-02-08 ENCOUNTER — Encounter (HOSPITAL_COMMUNITY): Payer: Self-pay | Admitting: Obstetrics and Gynecology

## 2022-02-08 DIAGNOSIS — O26891 Other specified pregnancy related conditions, first trimester: Secondary | ICD-10-CM | POA: Insufficient documentation

## 2022-02-08 DIAGNOSIS — R109 Unspecified abdominal pain: Secondary | ICD-10-CM | POA: Diagnosis not present

## 2022-02-08 DIAGNOSIS — O039 Complete or unspecified spontaneous abortion without complication: Secondary | ICD-10-CM

## 2022-02-08 DIAGNOSIS — O161 Unspecified maternal hypertension, first trimester: Secondary | ICD-10-CM | POA: Diagnosis not present

## 2022-02-08 DIAGNOSIS — O209 Hemorrhage in early pregnancy, unspecified: Secondary | ICD-10-CM | POA: Insufficient documentation

## 2022-02-08 DIAGNOSIS — Z3A01 Less than 8 weeks gestation of pregnancy: Secondary | ICD-10-CM | POA: Insufficient documentation

## 2022-02-08 DIAGNOSIS — N939 Abnormal uterine and vaginal bleeding, unspecified: Secondary | ICD-10-CM

## 2022-02-08 DIAGNOSIS — Z419 Encounter for procedure for purposes other than remedying health state, unspecified: Secondary | ICD-10-CM | POA: Diagnosis not present

## 2022-02-08 LAB — CBC
HCT: 32.7 % — ABNORMAL LOW (ref 36.0–46.0)
Hemoglobin: 11.5 g/dL — ABNORMAL LOW (ref 12.0–15.0)
MCH: 29 pg (ref 26.0–34.0)
MCHC: 35.2 g/dL (ref 30.0–36.0)
MCV: 82.4 fL (ref 80.0–100.0)
Platelets: 145 10*3/uL — ABNORMAL LOW (ref 150–400)
RBC: 3.97 MIL/uL (ref 3.87–5.11)
RDW: 13.5 % (ref 11.5–15.5)
WBC: 6.2 10*3/uL (ref 4.0–10.5)
nRBC: 0 % (ref 0.0–0.2)

## 2022-02-08 LAB — HCG, QUANTITATIVE, PREGNANCY: hCG, Beta Chain, Quant, S: 4402 m[IU]/mL — ABNORMAL HIGH (ref <5)

## 2022-02-08 MED ORDER — IBUPROFEN 600 MG PO TABS: 600.0000 mg | ORAL_TABLET | Freq: Four times a day (QID) | ORAL | 3 refills | Status: DC | PRN

## 2022-02-08 NOTE — MAU Provider Note (Signed)
History     CSN: [id number]  Arrival date and time: 02/08/22 8588   None     Chief Complaint  Patient presents with   Vaginal Bleeding   Abdominal Pain   HPI Karen Howard is a 41 y.o. [id number] at unknown gestation who presents to MAU for abdominal pain and vaginal bleeding. Patient reports she was seen on Thursday, however the pain has persisted and vaginal bleeding has gotten heavier. She reports pain feels like a contraction, currently rating 6/10. She has not tried anything to relieve pain. She reports she is having heavy vaginal bleeding, having to change her pad 3x/day. She reports passing some small blood clots. LMP: unsure   OB History     Gravida  12   Para  10   Term  10   Preterm  0   AB  1   Living  10      SAB  1   IAB  0   Ectopic  0   Multiple  0   Live Births  10           Past Medical History:  Diagnosis Date   Gestational diabetes    Hypertension     Past Surgical History:  Procedure Laterality Date   NO PAST SURGERIES      Family History  Problem Relation Age of Onset   Other Mother        died when she was young, "from the war"   Other Father        died when she was you, "from sickness" doesn't know what    Social History   Tobacco Use   Smoking status: Never   Smokeless tobacco: Never  Vaping Use   Vaping Use: Never used  Substance Use Topics   Alcohol use: No   Drug use: No    Allergies:  Allergies  Allergen Reactions   Pork-Derived Products Swelling    Medications Prior to Admission  Medication Sig Dispense Refill Last Dose   Prenat-Fe Poly-Methfol-FA-DHA (VITAFOL ULTRA) 29-0.6-0.4-200 MG CAPS Take 1 capsule by mouth daily.  11     Review of Systems  Constitutional: Negative.   Respiratory: Negative.    Cardiovascular: Negative.   Gastrointestinal:  Positive for abdominal pain.  Genitourinary:  Positive for vaginal bleeding.  Musculoskeletal: Negative.   Neurological: Negative.    Physical Exam    Blood pressure 121/69, pulse 62, temperature 98 F (36.7 C), temperature source Oral, resp. rate 16, height 5\' 4"  (1.626 m), weight 101.6 kg, SpO2 98 %, currently breastfeeding.  Physical Exam Vitals and nursing note reviewed. Exam conducted with a chaperone present.  Constitutional:      General: She is not in acute distress.    Appearance: She is obese.  Cardiovascular:     Rate and Rhythm: Normal rate.  Pulmonary:     Effort: Pulmonary effort is normal.  Abdominal:     Palpations: Abdomen is soft.     Tenderness: There is no abdominal tenderness.  Genitourinary:    Comments: Normal external female genitalia, vaginal walls pink with rugae, moderate amount of bright red bleeding with clots- removed with several fox swabs, cervix visually closed without lesion/masses Skin:    General: Skin is warm and dry.  Neurological:     General: No focal deficit present.     Mental Status: She is alert and oriented to person, place, and time.  Psychiatric:        Mood and Affect: Mood  normal.        Behavior: Behavior normal.    US OB Transvaginal  Result Date: 02/08/2022 CLINICAL DATA:  Abdominal cramping and vaginal bleeding. Estimated gestational age by mean sac diameter (02/06/2022 ultrasound) equals 7 weeks 4 days. EXAM: TRANSVAGINAL OB ULTRASOUND TECHNIQUE: Transvaginal ultrasound was performed for complete evaluation of the gestation as well as the maternal uterus, adnexal regions, and pelvic cul-de-sac. COMPARISON:  Ob ultrasound 02/06/2022 FINDINGS: Intrauterine gestational sac: Not identified Yolk sac:  Not identified Embryo:  Not identified Subchorionic hemorrhage:  None visualized. Maternal uterus/adnexae: The gestational sac and yolk sac seen on ultrasound 02/06/2022 is no longer present. 3 cm echogenic rounded focus within the endometrial canal is noted. IMPRESSION: 1. The gestational sac and yolk sac seen on ultrasound 02/06/2022 are no longer identified. Findings most consistent  with spontaneous abortion in progress. 2. Echogenic focus within the endometrial canal consistent with hemorrhage related to spontaneous abortion in progress. Electronically Signed   By: Suzy Bouchard M.D.   On: 02/08/2022 10:06    MAU Course  Procedures  MDM CBC, HCG Ultrasound  Ultrasound shows miscarriage in progress. Previously visualized GS and YS no longer visualized. H&H stable from previous visit although there is a drop in platelets. Bleeding is stable. Minimal amount on pad. Patient is asymptomatic. Would recommend to check CBC and HCG at follow up appointment. Swahili interpretor used for entirety of today's visit.  Assessment and Plan  SAB Vaginal bleeding  - Discharge home in stable condition - Strict return precautions reviewed. Return to MAU as needed for new/worsening symptoms - Follow up at [facility] in 2 weeks with provider. Message sent to office - Rx for Ibuprofen sent to [place], CNM 02/08/2022, 10:49 AM

## 2022-02-08 NOTE — MAU Note (Signed)
Karen Howard is a 41 y.o. at [redacted]w[redacted]d here in MAU reporting: having a lot of stomach pain, hasn't been able to sleep because of the pain. Ongoing since Thursday night when she was here. Has gotten worse and is bleeding a lot. Changing every 2 hrs, some clots. Has not taken anything for the pain.  LMP: no bleeding since SAB last year Onset of complaint: Thursday Pain score: 6 was worse last night Vitals:   02/08/22 0726  BP: 137/80  Pulse: 83  Resp: 18  Temp: 98 F (36.7 C)  SpO2: 97%      Lab orders placed from triage:  none

## 2022-02-09 LAB — GC/CHLAMYDIA PROBE AMP (~~LOC~~) NOT AT ARMC
Chlamydia: NEGATIVE
Comment: NEGATIVE
Comment: NORMAL
Neisseria Gonorrhea: NEGATIVE

## 2022-02-09 LAB — POCT PREGNANCY, URINE: Preg Test, Ur: POSITIVE — AB

## 2022-02-18 ENCOUNTER — Inpatient Hospital Stay: Admission: RE | Admit: 2022-02-18 | Payer: Medicaid Other | Source: Ambulatory Visit

## 2022-02-27 ENCOUNTER — Encounter: Payer: Self-pay | Admitting: Certified Nurse Midwife

## 2022-02-27 ENCOUNTER — Ambulatory Visit (INDEPENDENT_AMBULATORY_CARE_PROVIDER_SITE_OTHER): Payer: Medicaid Other | Admitting: Certified Nurse Midwife

## 2022-02-27 VITALS — BP 145/91 | HR 67 | Wt 228.2 lb

## 2022-02-27 DIAGNOSIS — O039 Complete or unspecified spontaneous abortion without complication: Secondary | ICD-10-CM

## 2022-02-27 DIAGNOSIS — Z23 Encounter for immunization: Secondary | ICD-10-CM

## 2022-02-27 DIAGNOSIS — Z789 Other specified health status: Secondary | ICD-10-CM | POA: Diagnosis not present

## 2022-02-27 DIAGNOSIS — Z3009 Encounter for other general counseling and advice on contraception: Secondary | ICD-10-CM

## 2022-02-28 LAB — BETA HCG QUANT (REF LAB): hCG Quant: 3 m[IU]/mL

## 2022-03-02 NOTE — Progress Notes (Addendum)
   Subjective:   Patient Name: Karen Howard, female   DOB: 1980/07/30, 40 y.o.  MRN: [id number]  HPI Seen for follow up of SAB  2 weeks ago. Denies bleeding, cramping. Menses has not returned yet. Does not plan on becoming pregnant soon.     Review of Systems     Objective:   Physical Exam  Constitutional: She is oriented to person, place, and time. She appears well-developed and well-nourished.  Cardiovascular: Normal rate.  Abdominal: Soft. She exhibits no distension.  Neurological: She is alert and oriented to person, place, and time.  Skin: Skin is warm and dry.  Psychiatric: She has a normal mood and affect. Her behavior is normal. Judgment and thought content normal.      Assessment & Plan:  1. SAB (spontaneous abortion) Patient counseled regarding future pregnancy plans and timing  Contraception: Patient desires no contraception at this time.  - IBH counseling offered. Patient declined.  - She has concerns for bills as patient is in the process of applying for Medicaid. Number and address provided for patient.  - Lab Orders         Beta hCG quant (ref lab)    - Offered annual GYN care, but patient declined.   - Due to language barrier, an interpreter was present during the history-taking and subsequent discussion (and for part of the physical exam) with this patient.    Jacquiline Doe MSN, CNM 03/02/22  11:31 PM

## 2022-03-11 DIAGNOSIS — Z419 Encounter for procedure for purposes other than remedying health state, unspecified: Secondary | ICD-10-CM | POA: Diagnosis not present

## 2022-04-10 DIAGNOSIS — Z419 Encounter for procedure for purposes other than remedying health state, unspecified: Secondary | ICD-10-CM | POA: Diagnosis not present

## 2022-07-10 DIAGNOSIS — Z419 Encounter for procedure for purposes other than remedying health state, unspecified: Secondary | ICD-10-CM | POA: Diagnosis not present

## 2022-08-10 DIAGNOSIS — Z419 Encounter for procedure for purposes other than remedying health state, unspecified: Secondary | ICD-10-CM | POA: Diagnosis not present

## 2022-09-09 DIAGNOSIS — Z419 Encounter for procedure for purposes other than remedying health state, unspecified: Secondary | ICD-10-CM | POA: Diagnosis not present

## 2022-10-10 DIAGNOSIS — Z419 Encounter for procedure for purposes other than remedying health state, unspecified: Secondary | ICD-10-CM | POA: Diagnosis not present

## 2022-11-09 DIAGNOSIS — Z419 Encounter for procedure for purposes other than remedying health state, unspecified: Secondary | ICD-10-CM | POA: Diagnosis not present

## 2022-12-10 DIAGNOSIS — Z419 Encounter for procedure for purposes other than remedying health state, unspecified: Secondary | ICD-10-CM | POA: Diagnosis not present

## 2023-01-10 DIAGNOSIS — Z419 Encounter for procedure for purposes other than remedying health state, unspecified: Secondary | ICD-10-CM | POA: Diagnosis not present

## 2023-01-25 ENCOUNTER — Ambulatory Visit (HOSPITAL_COMMUNITY)
Admission: EM | Admit: 2023-01-25 | Discharge: 2023-01-25 | Disposition: A | Discharge status: Home / Self Care | Payer: Medicaid Other | Attending: Physician Assistant | Admitting: Physician Assistant

## 2023-01-25 ENCOUNTER — Encounter (HOSPITAL_COMMUNITY): Payer: Self-pay

## 2023-01-25 DIAGNOSIS — N611 Abscess of the breast and nipple: Secondary | ICD-10-CM

## 2023-01-25 MED ORDER — DOXYCYCLINE HYCLATE 100 MG PO CAPS: 100.0000 mg | ORAL_CAPSULE | Freq: Two times a day (BID) | ORAL | 0 refills | Status: DC

## 2023-01-25 NOTE — ED Triage Notes (Signed)
Pt presents with abscess under her left breast x 1 week. Pt reports pain and bleeding.

## 2023-01-25 NOTE — ED Provider Notes (Signed)
[facility]    CSN: [id number] Arrival date & time: 01/25/23  1212      History   Chief Complaint Chief Complaint  Patient presents with   Breast Pain    HPI Karen Howard is a 42 y.o. female.   Patient here today for evaluation of possible abscess under her left breast.  She states that she first noticed area about a week ago.  She has had drainage and bleeding from the area.  She denies any fever.  She has not had any nausea or vomiting.  The history is provided by the patient. The history is limited by a language barrier. A language interpreter was used Gwenyth Bouillon).    Past Medical History:  Diagnosis Date   Gestational diabetes    Hypertension     Patient Active Problem List   Diagnosis Date Noted   Anemia 02/02/2018   Postpartum care and examination 11/25/2017   Contraception management 11/25/2017   Cerumen impaction 10/15/2017   Language barrier 10/07/2017   Abnormal glucose tolerance test (GTT) during pregnancy, antepartum 10/06/2017   Sickle cell trait (HCC) 12/06/2015    Past Surgical History:  Procedure Laterality Date   NO PAST SURGERIES      OB History     Gravida  12   Para  10   Term  10   Preterm  0   AB  1   Living  10      SAB  1   IAB  0   Ectopic  0   Multiple  0   Live Births  10            Home Medications    Prior to Admission medications   Medication Sig Start Date End Date Taking? Authorizing Provider  doxycycline (VIBRAMYCIN) 100 MG capsule Take 1 capsule (100 mg total) by mouth 2 (two) times daily. 01/25/23  Yes Tomi Bamberger, PA-C    Family History Family History  Problem Relation Age of Onset   Other Mother        died when she was young, "from the war"   Other Father        died when she was you, "from sickness" doesn't know what    Social History Social History   Tobacco Use   Smoking status: Never   Smokeless tobacco: Never  Vaping Use   Vaping status: Never Used  Substance  Use Topics   Alcohol use: No   Drug use: No     Allergies   Pork-derived products   Review of Systems Review of Systems  Constitutional:  Negative for chills and fever.  Eyes:  Negative for discharge and redness.  Respiratory:  Negative for shortness of breath.   Gastrointestinal:  Negative for abdominal pain, nausea and vomiting.  Skin:  Positive for color change and wound.     Physical Exam Triage Vital Signs ED Triage Vitals  Encounter Vitals Group     BP      Systolic BP Percentile      Diastolic BP Percentile      Pulse      Resp      Temp      Temp src      SpO2      Weight      Height      Head Circumference      Peak Flow      Pain Score      Pain Loc  Pain Education      Exclude from Growth Chart    No data found.  Updated Vital Signs BP (!) 151/91 (BP Location: Left Arm)   Pulse 74   Temp 98.1 F (36.7 C) (Oral)   Resp 16   LMP 01/22/2023   SpO2 96%      Physical Exam Vitals and nursing note reviewed.  Constitutional:      General: She is not in acute distress.    Appearance: Normal appearance. She is not ill-appearing.  HENT:     Head: Normocephalic and atraumatic.  Eyes:     Conjunctiva/sclera: Conjunctivae normal.  Cardiovascular:     Rate and Rhythm: Normal rate.  Pulmonary:     Effort: Pulmonary effort is normal. No respiratory distress.  Skin:    Comments: Approximately 3 cm open wound to left lower breast with purulent drainage noted, surrounding erythema noted  Neurological:     Mental Status: She is alert.  Psychiatric:        Mood and Affect: Mood normal.        Behavior: Behavior normal.        Thought Content: Thought content normal.      UC Treatments / Results  Labs (all labs ordered are listed, but only abnormal results are displayed) Labs Reviewed - No data to display  EKG   Radiology No results found.  Procedures Procedures (including critical care time)  Medications Ordered in UC Medications -  No data to display  Initial Impression / Assessment and Plan / UC Course  I have reviewed the triage vital signs and the nursing notes.  Pertinent labs & imaging results that were available during my care of the patient were reviewed by me and considered in my medical decision making (see chart for details).    Doxycycline prescribed to cover breast abscess and recommended follow-up if no gradual improvement over the next week or sooner in the emergency department any worsening.  Patient expresses understanding.  Final Clinical Impressions(s) / UC Diagnoses   Final diagnoses:  Breast abscess   Discharge Instructions   None    ED Prescriptions     Medication Sig Dispense Auth. Provider   doxycycline (VIBRAMYCIN) 100 MG capsule Take 1 capsule (100 mg total) by mouth 2 (two) times daily. 20 capsule Tomi Bamberger, PA-C      PDMP not reviewed this encounter.   Tomi Bamberger, PA-C 01/25/23 1535

## 2023-01-25 NOTE — ED Notes (Signed)
Pt left before her dressing could be applied. Attempted to call her no answer or voicemail.

## 2023-02-09 DIAGNOSIS — Z419 Encounter for procedure for purposes other than remedying health state, unspecified: Secondary | ICD-10-CM | POA: Diagnosis not present

## 2023-04-11 DIAGNOSIS — Z419 Encounter for procedure for purposes other than remedying health state, unspecified: Secondary | ICD-10-CM | POA: Diagnosis not present

## 2023-04-13 ENCOUNTER — Ambulatory Visit (INDEPENDENT_AMBULATORY_CARE_PROVIDER_SITE_OTHER): Payer: Medicaid Other

## 2023-04-13 ENCOUNTER — Encounter (HOSPITAL_COMMUNITY): Payer: Self-pay

## 2023-04-13 ENCOUNTER — Ambulatory Visit (HOSPITAL_COMMUNITY)
Admission: EM | Admit: 2023-04-13 | Discharge: 2023-04-13 | Disposition: A | Discharge status: Home / Self Care | Payer: Medicaid Other | Attending: Family Medicine | Admitting: Family Medicine

## 2023-04-13 DIAGNOSIS — J069 Acute upper respiratory infection, unspecified: Secondary | ICD-10-CM

## 2023-04-13 DIAGNOSIS — R062 Wheezing: Secondary | ICD-10-CM | POA: Diagnosis not present

## 2023-04-13 DIAGNOSIS — J42 Unspecified chronic bronchitis: Secondary | ICD-10-CM | POA: Diagnosis not present

## 2023-04-13 DIAGNOSIS — R059 Cough, unspecified: Secondary | ICD-10-CM | POA: Diagnosis not present

## 2023-04-13 DIAGNOSIS — R519 Headache, unspecified: Secondary | ICD-10-CM | POA: Diagnosis not present

## 2023-04-13 MED ORDER — BENZONATATE 200 MG PO CAPS: 200.0000 mg | ORAL_CAPSULE | Freq: Three times a day (TID) | ORAL | 0 refills | Status: DC | PRN

## 2023-04-13 MED ORDER — METHYLPREDNISOLONE 4 MG PO TBPK: ORAL_TABLET | ORAL | 0 refills | Status: DC

## 2023-04-13 NOTE — ED Provider Notes (Signed)
[facility]    CSN: [id number] Arrival date & time: 04/13/23  1238      History   Chief Complaint Chief Complaint  Patient presents with   Cough    HPI Karen Howard is a 42 y.o. female.    Cough Associated symptoms: shortness of breath   Associated symptoms: no rhinorrhea    Interpreter used today.  She is here for cough and headache x 2 days.  No fevers.  Her ribs feel tight.  Some sob as well.  No runny nose, congestion or drainage.  No OTC medications used.  No h/o asthma or other lung problem.        Past Medical History:  Diagnosis Date   Gestational diabetes    Hypertension     Patient Active Problem List   Diagnosis Date Noted   Anemia 02/02/2018   Postpartum care and examination 11/25/2017   Contraception management 11/25/2017   Cerumen impaction 10/15/2017   Language barrier 10/07/2017   Abnormal glucose tolerance test (GTT) during pregnancy, antepartum 10/06/2017   Sickle cell trait (HCC) 12/06/2015    Past Surgical History:  Procedure Laterality Date   NO PAST SURGERIES      OB History     Gravida  12   Para  10   Term  10   Preterm  0   AB  1   Living  10      SAB  1   IAB  0   Ectopic  0   Multiple  0   Live Births  10            Home Medications    Prior to Admission medications   Not on File    Family History Family History  Problem Relation Age of Onset   Other Mother        died when she was young, "from the war"   Other Father        died when she was you, "from sickness" doesn't know what    Social History Social History   Tobacco Use   Smoking status: Never   Smokeless tobacco: Never  Vaping Use   Vaping status: Never Used  Substance Use Topics   Alcohol use: No   Drug use: No     Allergies   Pork-derived products   Review of Systems Review of Systems  HENT:  Negative for congestion and rhinorrhea.   Respiratory:  Positive for cough and shortness of breath.    Cardiovascular: Negative.   Gastrointestinal: Negative.   Genitourinary: Negative.   Musculoskeletal: Negative.   Psychiatric/Behavioral: Negative.       Physical Exam Triage Vital Signs ED Triage Vitals  Encounter Vitals Group     BP 04/13/23 1303 (!) 144/83     Systolic BP Percentile --      Diastolic BP Percentile --      Pulse Rate 04/13/23 1303 82     Resp 04/13/23 1303 18     Temp 04/13/23 1303 98.1 F (36.7 C)     Temp Source 04/13/23 1303 Oral     SpO2 04/13/23 1303 95 %     Weight --      Height --      Head Circumference --      Peak Flow --      Pain Score 04/13/23 1307 5     Pain Loc --      Pain Education --  Exclude from Growth Chart --    No data found.  Updated Vital Signs BP (!) 144/83 (BP Location: Right Arm)   Pulse 82   Temp 98.1 F (36.7 C) (Oral)   Resp 18   SpO2 95%   Visual Acuity Right Eye Distance:   Left Eye Distance:   Bilateral Distance:    Right Eye Near:   Left Eye Near:    Bilateral Near:     Physical Exam Constitutional:      General: She is not in acute distress.    Appearance: Normal appearance. She is not ill-appearing.  HENT:     Nose: Nose normal.     Mouth/Throat:     Mouth: Mucous membranes are moist.  Cardiovascular:     Rate and Rhythm: Normal rate and regular rhythm.  Pulmonary:     Effort: Pulmonary effort is normal.     Breath sounds: Wheezing present.     Comments: Very mild end expiratory wheezes noted Musculoskeletal:     Cervical back: Normal range of motion and neck supple. No tenderness.     Comments: TTP to the anterior chest wall/sternum  Skin:    General: Skin is warm.  Neurological:     General: No focal deficit present.     Mental Status: She is alert.  Psychiatric:        Mood and Affect: Mood normal.      UC Treatments / Results  Labs (all labs ordered are listed, but only abnormal results are displayed) Labs Reviewed - No data to display  EKG   Radiology DG Chest 2  View  Result Date: 04/13/2023 CLINICAL DATA:  Cough and headache for the past 2 days. EXAM: CHEST - 2 VIEW COMPARISON:  05/23/2015 FINDINGS: Normal sized heart. Stable calcified granulomata in the left upper lobe with additional calcified granulomata at one of the posterior lung bases on the lateral view. Mild peribronchial thickening without significant change. No significant change in the left upper lobe pleural-based density. No airspace consolidation or pleural fluid. Unremarkable bones. IMPRESSION: Stable mild chronic bronchitic changes.  No acute abnormality. Electronically Signed   By: Beckie Salts M.D.   On: 04/13/2023 14:13    Procedures Procedures (including critical care time)  Medications Ordered in UC Medications - No data to display  Initial Impression / Assessment and Plan / UC Course  I have reviewed the triage vital signs and the nursing notes.  Pertinent labs & imaging results that were available during my care of the patient were reviewed by me and considered in my medical decision making (see chart for details).   Final Clinical Impressions(s) / UC Diagnoses   Final diagnoses:  Viral URI with cough  Wheezing     Discharge Instructions      Umeonekana leo kwa kikohozi na maumivu ya kifua.  Maumivu yako ni ya misuli kutokana na kikohozi chochote unachofanya.  Ninapendekeza utumie motrin kwa maumivu, na joto na barafu.  Hii itaboresha kadiri kikohozi [employer].  Xray ya kifua chako imesomwa kama kawaida leo.  Nimetuma dawa mbili za kusaidia kikohozi.  Tafadhali rudi ikiwa haujaboresha au unazidi [place].  You were seen today for cough and chest pain.  Your pain is muscular from all the coughing you are doing.  I recommend you use motrin for pain, and heat and ice.  This will improve as your cough improves.  Your chest xray was read as normal today.  I have sent two medications to help with  the cough.  Please return if you are not improving or  worsening.      ED Prescriptions     Medication Sig Dispense Auth. Provider   benzonatate (TESSALON) 200 MG capsule Take 1 capsule (200 mg total) by mouth 3 (three) times daily as needed for cough. 21 capsule Issak Goley, MD   methylPREDNISolone (MEDROL DOSEPAK) 4 MG TBPK tablet Take as directed 1 each Jannifer Franklin, MD      PDMP not reviewed this encounter.   Jannifer Franklin, MD 04/13/23 [phone]

## 2023-04-13 NOTE — ED Triage Notes (Signed)
Per interpreter, pt c/o headache and cough x2 days and causing bilateral rib pain. No meds taken for sx's.

## 2023-04-13 NOTE — Discharge Instructions (Addendum)
[place]union leo kwa kikohozi na maumivu ya kifua.  Maumivu yako ni ya misuli kutokana na kikohozi chochote unachofanya.  Ninapendekeza utumie motrin kwa maumivu, na joto na barafu.  Hii itaboresha kadiri kikohozi [employer].  Xray ya kifua chako imesomwa kama kawaida leo.  Nimetuma dawa mbili za kusaidia kikohozi.  Tafadhali rudi ikiwa haujaboresha au unazidi [place].  You were seen today for cough and chest pain.  Your pain is muscular from all the coughing you are doing.  I recommend you use motrin for pain, and heat and ice.  This will improve as your cough improves.  Your chest xray was read as normal today.  I have sent two medications to help with the cough.  Please return if you are not improving or worsening.

## 2023-05-12 DIAGNOSIS — Z419 Encounter for procedure for purposes other than remedying health state, unspecified: Secondary | ICD-10-CM | POA: Diagnosis not present

## 2023-05-25 ENCOUNTER — Encounter (HOSPITAL_COMMUNITY): Payer: Self-pay | Admitting: Emergency Medicine

## 2023-05-25 ENCOUNTER — Ambulatory Visit (HOSPITAL_COMMUNITY)
Admission: EM | Admit: 2023-05-25 | Discharge: 2023-05-25 | Disposition: A | Discharge status: Home / Self Care | Payer: Medicaid Other | Attending: Emergency Medicine | Admitting: Emergency Medicine

## 2023-05-25 DIAGNOSIS — I10 Essential (primary) hypertension: Secondary | ICD-10-CM | POA: Diagnosis not present

## 2023-05-25 MED ORDER — HYDROCHLOROTHIAZIDE 12.5 MG PO TABS: 12.5000 mg | ORAL_TABLET | Freq: Every day | ORAL | 0 refills | Status: AC

## 2023-05-25 NOTE — ED Provider Notes (Signed)
 [facility]    CSN: [id number] Arrival date & time: 05/25/23  1929      History   Chief Complaint Chief Complaint  Patient presents with   Hypertension    HPI Karen Howard is a 43 y.o. female.   Patient presents to urgent care after being seen by occupational health at work today and told that her blood pressure was high.  Patient reported her blood pressure was 179/83 earlier today.  Patient denies any symptoms at this time.   Hypertension    Past Medical History:  Diagnosis Date   Gestational diabetes    Hypertension     Patient Active Problem List   Diagnosis Date Noted   Anemia 02/02/2018   Postpartum care and examination 11/25/2017   Contraception management 11/25/2017   Cerumen impaction 10/15/2017   Language barrier 10/07/2017   Abnormal glucose tolerance test (GTT) during pregnancy, antepartum 10/06/2017   Sickle cell trait (HCC) 12/06/2015    Past Surgical History:  Procedure Laterality Date   NO PAST SURGERIES      OB History     Gravida  12   Para  10   Term  10   Preterm  0   AB  1   Living  10      SAB  1   IAB  0   Ectopic  0   Multiple  0   Live Births  10            Home Medications    Prior to Admission medications   Medication Sig Start Date End Date Taking? Authorizing Provider  hydrochlorothiazide  (HYDRODIURIL ) 12.5 MG tablet Take 1 tablet (12.5 mg total) by mouth daily. 05/25/23  Yes Johnie Rumaldo LABOR, NP    Family History Family History  Problem Relation Age of Onset   Other Mother        died when she was young, from the war   Other Father        died when she was you, from sickness doesn't know what    Social History Social History   Tobacco Use   Smoking status: Never   Smokeless tobacco: Never  Vaping Use   Vaping status: Never Used  Substance Use Topics   Alcohol use: No   Drug use: No     Allergies   Pork-derived products   Review of Systems Review of Systems   All other systems reviewed and are negative.    Physical Exam Triage Vital Signs ED Triage Vitals  Encounter Vitals Group     BP 05/25/23 2019 (!) 152/93     Systolic BP Percentile --      Diastolic BP Percentile --      Pulse Rate 05/25/23 2019 66     Resp 05/25/23 2019 17     Temp 05/25/23 2019 98 F (36.7 C)     Temp Source 05/25/23 2019 Oral     SpO2 05/25/23 2019 98 %     Weight --      Height --      Head Circumference --      Peak Flow --      Pain Score 05/25/23 2014 0     Pain Loc --      Pain Education --      Exclude from Growth Chart --    No data found.  Updated Vital Signs BP (!) 152/93 (BP Location: Left Arm)   Pulse 66   Temp 98  F (36.7 C) (Oral)   Resp 17   LMP 05/16/2023   SpO2 98%   Visual Acuity Right Eye Distance:   Left Eye Distance:   Bilateral Distance:    Right Eye Near:   Left Eye Near:    Bilateral Near:     Physical Exam Vitals and nursing note reviewed.  Constitutional:      General: She is awake. She is not in acute distress.    Appearance: Normal appearance. She is well-developed and well-groomed. She is not ill-appearing.  Eyes:     Extraocular Movements: Extraocular movements intact.     Pupils: Pupils are equal, round, and reactive to light.  Cardiovascular:     Rate and Rhythm: Normal rate and regular rhythm.  Pulmonary:     Effort: Pulmonary effort is normal.     Breath sounds: Normal breath sounds.  Skin:    General: Skin is warm and dry.  Neurological:     General: No focal deficit present.     Mental Status: She is alert and oriented to person, place, and time. Mental status is at baseline.     GCS: GCS eye subscore is 4. GCS verbal subscore is 5. GCS motor subscore is 6.  Psychiatric:        Behavior: Behavior is cooperative.      UC Treatments / Results  Labs (all labs ordered are listed, but only abnormal results are displayed) Labs Reviewed - No data to display  EKG   Radiology No results  found.  Procedures Procedures (including critical care time)  Medications Ordered in UC Medications - No data to display  Initial Impression / Assessment and Plan / UC Course  I have reviewed the triage vital signs and the nursing notes.  Pertinent labs & imaging results that were available during my care of the patient were reviewed by me and considered in my medical decision making (see chart for details).     Patient presented to urgent care after being seen by occupational health at work today and was told her blood pressure was 179/83.  Patient denies any symptoms earlier today or currently.  Patient denies history of high blood pressure.  No neurodeficits noted on exam.  No other significant findings upon assessment.  GCS 15.  EOMI and PERRLA.  Prescribed low-dose of hydrochlorothiazide  for primary hypertension.  Had clinical staff help set up a primary care appointment for patient prior to leaving the clinic today.  Discussed follow-up, return, strict emergency department precautions. Final Clinical Impressions(s) / UC Diagnoses   Final diagnoses:  Primary hypertension     Discharge Instructions      I have sent a blood pressure medication by the name of hydrochlorothiazide  to the pharmacy.  Start taking this daily and follow-up with a primary care doctor to further manage your blood pressure.  If you develop severe headache, blurred vision, numbness, weakness, confusion, or severe chest pain please seek immediate medical treatment in the emergency department.  Nimetuma dawa ya shinikizo la damu kwa jina la hydrochlorothiazide  kwenye duka la dawa.  Phil [place] hii kila siku na ufuatilie na daktari wa huduma ya msingi ili kudhibiti shinikizo la damu yako.  [place] maumivu makali ya [place], [place], kufa ganzi, [place], [place], au maumivu makali ya [place] tafadhali tafuta matibabu ya lamarr klippel idara ya dharura.    ED Prescriptions     Medication Sig  Dispense Auth. Provider   hydrochlorothiazide  (HYDRODIURIL ) 12.5 MG tablet Take 1 tablet (12.5  mg total) by mouth daily. 30 tablet Johnie Flaming A, NP      PDMP not reviewed this encounter.   Johnie Flaming A, NP 05/25/23 [phone]

## 2023-05-25 NOTE — ED Triage Notes (Addendum)
 Used interpretor  Pt reports told to be seen due to her blood pressure being high. Pt has paperwork form that needs to be filled out regarding types of job duties, restrictions. Pt reports that she has been told that her blood pressure has been high before but doesn't take any medications for it. 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  Pt reports that the sensations happen when she eats pork.

## 2023-05-25 NOTE — Discharge Instructions (Addendum)
 I have sent a blood pressure medication by the name of hydrochlorothiazide  to the pharmacy.  Start taking this daily and follow-up with a primary care doctor to further manage your blood pressure.  If you develop severe headache, blurred vision, numbness, weakness, confusion, or severe chest pain please seek immediate medical treatment in the emergency department.  Nimetuma dawa ya shinikizo la damu kwa jina la hydrochlorothiazide  kwenye duka la dawa.  Phil [place] hii kila siku na ufuatilie na daktari wa huduma ya msingi ili kudhibiti shinikizo la damu yako.  [place] maumivu makali ya [place], [place] vizuri, kufa ganzi, [place], [place], au maumivu makali ya [place] tafadhali tafuta matibabu ya lamarr klippel idara ya dharura.

## 2023-06-04 ENCOUNTER — Encounter: Payer: Medicaid Other | Admitting: Family

## 2023-06-04 NOTE — Progress Notes (Signed)
This encounter was created in error - please disregard. No show

## 2023-06-12 DIAGNOSIS — Z419 Encounter for procedure for purposes other than remedying health state, unspecified: Secondary | ICD-10-CM | POA: Diagnosis not present

## 2023-07-10 DIAGNOSIS — Z419 Encounter for procedure for purposes other than remedying health state, unspecified: Secondary | ICD-10-CM | POA: Diagnosis not present

## 2023-08-21 DIAGNOSIS — Z419 Encounter for procedure for purposes other than remedying health state, unspecified: Secondary | ICD-10-CM | POA: Diagnosis not present

## 2023-09-20 DIAGNOSIS — Z419 Encounter for procedure for purposes other than remedying health state, unspecified: Secondary | ICD-10-CM | POA: Diagnosis not present

## 2023-10-21 DIAGNOSIS — Z419 Encounter for procedure for purposes other than remedying health state, unspecified: Secondary | ICD-10-CM | POA: Diagnosis not present

## 2023-11-20 DIAGNOSIS — Z419 Encounter for procedure for purposes other than remedying health state, unspecified: Secondary | ICD-10-CM | POA: Diagnosis not present

## 2023-12-21 DIAGNOSIS — Z419 Encounter for procedure for purposes other than remedying health state, unspecified: Secondary | ICD-10-CM | POA: Diagnosis not present

## 2024-01-21 DIAGNOSIS — Z419 Encounter for procedure for purposes other than remedying health state, unspecified: Secondary | ICD-10-CM | POA: Diagnosis not present
